# Patient Record
Sex: Male | Born: 1960 | Race: White | Hispanic: No | Marital: Married | Smoking: Never smoker
Health system: Southern US, Community
[De-identification: ages and names within clinical notes are randomized; demographics above are authoritative.]

## PROBLEM LIST (undated history)

## (undated) DIAGNOSIS — E785 Hyperlipidemia, unspecified: Secondary | ICD-10-CM

## (undated) DIAGNOSIS — IMO0001 Reserved for inherently not codable concepts without codable children: Secondary | ICD-10-CM

## (undated) DIAGNOSIS — R9431 Abnormal electrocardiogram [ECG] [EKG]: Secondary | ICD-10-CM

## (undated) DIAGNOSIS — Z8679 Personal history of other diseases of the circulatory system: Secondary | ICD-10-CM

## (undated) HISTORY — DX: Hyperlipidemia, unspecified: E78.5

## (undated) HISTORY — DX: Personal history of other diseases of the circulatory system: Z86.79

## (undated) HISTORY — DX: Reserved for inherently not codable concepts without codable children: IMO0001

## (undated) HISTORY — DX: Abnormal electrocardiogram (ECG) (EKG): R94.31

---
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
## 2001-05-06 ENCOUNTER — Encounter (HOSPITAL_COMMUNITY): Payer: Self-pay | Admitting: Oncology

## 2001-05-06 ENCOUNTER — Encounter (HOSPITAL_COMMUNITY): Admission: RE | Admit: 2001-05-06 | Discharge: 2001-06-05 | Payer: Self-pay | Admitting: Oncology

## 2001-05-06 ENCOUNTER — Encounter: Admission: RE | Admit: 2001-05-06 | Discharge: 2001-05-06 | Payer: Self-pay | Admitting: Oncology

## 2002-05-05 ENCOUNTER — Encounter (HOSPITAL_COMMUNITY): Admission: RE | Admit: 2002-05-05 | Discharge: 2002-06-04 | Payer: Self-pay | Admitting: Oncology

## 2002-05-05 ENCOUNTER — Encounter (HOSPITAL_COMMUNITY): Payer: Self-pay | Admitting: Oncology

## 2002-05-05 ENCOUNTER — Encounter: Admission: RE | Admit: 2002-05-05 | Discharge: 2002-05-05 | Payer: Self-pay | Admitting: Oncology

## 2003-05-07 ENCOUNTER — Encounter (HOSPITAL_COMMUNITY): Admission: RE | Admit: 2003-05-07 | Discharge: 2003-06-06 | Payer: Self-pay | Admitting: Oncology

## 2003-05-07 ENCOUNTER — Encounter (HOSPITAL_COMMUNITY): Payer: Self-pay | Admitting: Oncology

## 2003-05-07 ENCOUNTER — Encounter: Admission: RE | Admit: 2003-05-07 | Discharge: 2003-05-07 | Payer: Self-pay | Admitting: Oncology

## 2004-05-03 ENCOUNTER — Encounter: Admission: RE | Admit: 2004-05-03 | Discharge: 2004-06-02 | Payer: Self-pay | Admitting: Oncology

## 2004-05-03 ENCOUNTER — Encounter (HOSPITAL_COMMUNITY): Admission: RE | Admit: 2004-05-03 | Discharge: 2004-06-02 | Payer: Self-pay | Admitting: Oncology

## 2005-05-02 ENCOUNTER — Ambulatory Visit (HOSPITAL_COMMUNITY): Payer: Self-pay | Admitting: Oncology

## 2005-05-02 ENCOUNTER — Encounter (HOSPITAL_COMMUNITY): Admission: RE | Admit: 2005-05-02 | Discharge: 2005-06-01 | Payer: Self-pay | Admitting: Oncology

## 2005-05-02 ENCOUNTER — Encounter: Admission: RE | Admit: 2005-05-02 | Discharge: 2005-06-01 | Payer: Self-pay | Admitting: Oncology

## 2006-05-07 ENCOUNTER — Encounter (HOSPITAL_COMMUNITY): Admission: RE | Admit: 2006-05-07 | Discharge: 2006-05-31 | Payer: Self-pay | Admitting: Oncology

## 2006-05-07 ENCOUNTER — Ambulatory Visit (HOSPITAL_COMMUNITY): Payer: Self-pay | Admitting: Oncology

## 2006-05-07 ENCOUNTER — Encounter: Admission: RE | Admit: 2006-05-07 | Discharge: 2006-05-31 | Payer: Self-pay | Admitting: Oncology

## 2007-05-06 ENCOUNTER — Encounter (HOSPITAL_COMMUNITY): Admission: RE | Admit: 2007-05-06 | Discharge: 2007-06-03 | Payer: Self-pay | Admitting: Oncology

## 2007-05-06 ENCOUNTER — Ambulatory Visit (HOSPITAL_COMMUNITY): Payer: Self-pay | Admitting: Oncology

## 2008-05-05 ENCOUNTER — Encounter (HOSPITAL_COMMUNITY): Admission: RE | Admit: 2008-05-05 | Discharge: 2008-05-31 | Payer: Self-pay | Admitting: Oncology

## 2008-05-05 ENCOUNTER — Ambulatory Visit (HOSPITAL_COMMUNITY): Payer: Self-pay | Admitting: Oncology

## 2009-05-11 ENCOUNTER — Ambulatory Visit (HOSPITAL_COMMUNITY): Payer: Self-pay | Admitting: Oncology

## 2009-05-11 ENCOUNTER — Encounter (HOSPITAL_COMMUNITY): Admission: RE | Admit: 2009-05-11 | Discharge: 2009-06-01 | Payer: Self-pay | Admitting: Oncology

## 2009-09-03 DIAGNOSIS — IMO0001 Reserved for inherently not codable concepts without codable children: Secondary | ICD-10-CM

## 2009-09-03 HISTORY — DX: Reserved for inherently not codable concepts without codable children: IMO0001

## 2009-11-04 ENCOUNTER — Ambulatory Visit (HOSPITAL_COMMUNITY): Payer: Self-pay | Admitting: Oncology

## 2009-11-04 ENCOUNTER — Encounter (HOSPITAL_COMMUNITY): Admission: RE | Admit: 2009-11-04 | Discharge: 2009-12-04 | Payer: Self-pay | Admitting: Oncology

## 2009-11-28 HISTORY — PX: CARDIOVASCULAR STRESS TEST: SHX262

## 2010-07-07 ENCOUNTER — Ambulatory Visit (HOSPITAL_COMMUNITY): Payer: Self-pay | Admitting: Oncology

## 2010-07-07 ENCOUNTER — Encounter (HOSPITAL_COMMUNITY)
Admission: RE | Admit: 2010-07-07 | Discharge: 2010-08-06 | Payer: Self-pay | Source: Home / Self Care | Admitting: Oncology

## 2010-11-14 LAB — LACTATE DEHYDROGENASE: LDH: 134 U/L (ref 94–250)

## 2010-11-14 LAB — CBC
HCT: 40 % (ref 39.0–52.0)
Hemoglobin: 12.9 g/dL — ABNORMAL LOW (ref 13.0–17.0)
MCH: 24 pg — ABNORMAL LOW (ref 26.0–34.0)
MCHC: 32.2 g/dL (ref 30.0–36.0)
MCV: 74.5 fL — ABNORMAL LOW (ref 78.0–100.0)
Platelets: 332 10*3/uL (ref 150–400)
RBC: 5.36 MIL/uL (ref 4.22–5.81)
RDW: 15.2 % (ref 11.5–15.5)
WBC: 9.4 10*3/uL (ref 4.0–10.5)

## 2010-11-14 LAB — COMPREHENSIVE METABOLIC PANEL
Albumin: 4.3 g/dL (ref 3.5–5.2)
Alkaline Phosphatase: 69 U/L (ref 39–117)
BUN: 14 mg/dL (ref 6–23)
Chloride: 101 mEq/L (ref 96–112)
Creatinine, Ser: 0.89 mg/dL (ref 0.4–1.5)
GFR calc non Af Amer: >60 mL/min (ref >60)
Glucose, Bld: 86 mg/dL (ref 70–99)
Potassium: 4.3 mEq/L (ref 3.5–5.1)
Total Bilirubin: 1 mg/dL (ref 0.3–1.2)

## 2010-11-14 LAB — LIPID PANEL
Cholesterol: 183 mg/dL (ref 0–200)
LDL Cholesterol: 105 mg/dL — ABNORMAL HIGH (ref 0–99)

## 2010-11-26 LAB — LACTATE DEHYDROGENASE: LDH: 136 U/L (ref 94–250)

## 2010-11-26 LAB — COMPREHENSIVE METABOLIC PANEL
AST: 25 U/L (ref 0–37)
CO2: 30 mEq/L (ref 19–32)
Calcium: 9.1 mg/dL (ref 8.4–10.5)
Chloride: 102 mEq/L (ref 96–112)
Creatinine, Ser: 0.97 mg/dL (ref 0.4–1.5)
GFR calc Af Amer: >60 mL/min (ref >60)
GFR calc non Af Amer: >60 mL/min (ref >60)
Glucose, Bld: 83 mg/dL (ref 70–99)
Total Bilirubin: 0.7 mg/dL (ref 0.3–1.2)

## 2010-11-26 LAB — CBC
HCT: 39.6 % (ref 39.0–52.0)
Hemoglobin: 13.1 g/dL (ref 13.0–17.0)
MCHC: 33 g/dL (ref 30.0–36.0)
MCV: 77 fL — ABNORMAL LOW (ref 78.0–100.0)
RBC: 5.15 MIL/uL (ref 4.22–5.81)
WBC: 8.9 10*3/uL (ref 4.0–10.5)

## 2010-11-26 LAB — DIFFERENTIAL
Basophils Absolute: 0 10*3/uL (ref 0.0–0.1)
Eosinophils Absolute: 0.1 10*3/uL (ref 0.0–0.7)
Eosinophils Relative: 1 % (ref 0–5)
Lymphocytes Relative: 22 % (ref 12–46)
Lymphs Abs: 2 10*3/uL (ref 0.7–4.0)
Neutrophils Relative %: 67 % (ref 43–77)

## 2010-11-26 LAB — SEDIMENTATION RATE: Sed Rate: 3 mm/hr (ref 0–16)

## 2010-12-08 LAB — DIFFERENTIAL
Eosinophils Relative: 2 % (ref 0–5)
Lymphocytes Relative: 18 % (ref 12–46)
Lymphs Abs: 1.7 10*3/uL (ref 0.7–4.0)
Monocytes Absolute: 0.7 10*3/uL (ref 0.1–1.0)
Monocytes Relative: 7 % (ref 3–12)

## 2010-12-08 LAB — CBC
MCHC: 33.1 g/dL (ref 30.0–36.0)
MCV: 75.9 fL — ABNORMAL LOW (ref 78.0–100.0)
Platelets: 312 10*3/uL (ref 150–400)
WBC: 9.5 10*3/uL (ref 4.0–10.5)

## 2010-12-08 LAB — LIPID PANEL
Cholesterol: 179 mg/dL (ref 0–200)
HDL: 48 mg/dL (ref >39)
LDL Cholesterol: 108 mg/dL — ABNORMAL HIGH (ref 0–99)
Triglycerides: 113 mg/dL (ref <150)

## 2010-12-08 LAB — COMPREHENSIVE METABOLIC PANEL
ALT: 24 U/L (ref 0–53)
AST: 24 U/L (ref 0–37)
Albumin: 4 g/dL (ref 3.5–5.2)
Calcium: 9.4 mg/dL (ref 8.4–10.5)
Creatinine, Ser: 0.84 mg/dL (ref 0.4–1.5)
GFR calc Af Amer: >60 mL/min (ref >60)
Sodium: 140 mEq/L (ref 135–145)

## 2010-12-08 LAB — TSH: TSH: 2.951 u[IU]/mL (ref 0.350–4.500)

## 2011-03-26 ENCOUNTER — Other Ambulatory Visit: Payer: Self-pay | Admitting: Cardiology

## 2011-03-26 DIAGNOSIS — E785 Hyperlipidemia, unspecified: Secondary | ICD-10-CM

## 2011-03-26 MED ORDER — SIMVASTATIN 40 MG PO TABS: 40.0000 mg | ORAL_TABLET | Freq: Every evening | ORAL | Status: DC

## 2011-03-26 NOTE — Telephone Encounter (Signed)
Patient needs labs.  Wife is going to talk to him and see if he wants to have at [facility] heart care or PCP.

## 2011-03-26 NOTE — Telephone Encounter (Signed)
Called needing a refill of Symbaststin 40mg  filled at [facility] in [place] [phone]. Wasn't sure if he needed to come in to have blood work done in relation to his medication. Please call back. I have pulled the chart.

## 2011-05-09 ENCOUNTER — Telehealth: Payer: Self-pay | Admitting: *Deleted

## 2011-05-09 DIAGNOSIS — E785 Hyperlipidemia, unspecified: Secondary | ICD-10-CM

## 2011-05-09 NOTE — Telephone Encounter (Signed)
Left message in July with wife for patient to call and schedule labs.  Never heard from patient.  Left message today and cancelled remaining refills except for one with request they put not for patient to call and schedule labs.  Left another message at home for patient to call for labs

## 2011-06-06 LAB — COMPREHENSIVE METABOLIC PANEL
AST: 26
CO2: 30
Calcium: 9.6
Creatinine, Ser: 0.82
GFR calc Af Amer: >60
GFR calc non Af Amer: >60

## 2011-06-06 LAB — CBC
MCHC: 32.5
MCV: 75.4 — ABNORMAL LOW
Platelets: 315
RBC: 5.32
RDW: 16.2 — ABNORMAL HIGH

## 2011-06-06 LAB — SEDIMENTATION RATE: Sed Rate: 2

## 2011-06-06 LAB — DIFFERENTIAL
Eosinophils Relative: 2
Lymphocytes Relative: 24
Lymphs Abs: 1.8
Neutrophils Relative %: 66

## 2011-06-06 LAB — LIPID PANEL
HDL: 52
Triglycerides: 84
VLDL: 17

## 2011-06-06 LAB — LACTATE DEHYDROGENASE: LDH: 124

## 2011-06-11 ENCOUNTER — Telehealth: Payer: Self-pay | Admitting: Nurse Practitioner

## 2011-06-11 NOTE — Telephone Encounter (Signed)
Refill needed of simvastatin 40mg , uses [facility] in [place]. Pt out needs asap

## 2011-06-13 ENCOUNTER — Other Ambulatory Visit: Payer: Self-pay | Admitting: *Deleted

## 2011-06-15 LAB — TSH: TSH: 3.809

## 2011-06-15 LAB — DIFFERENTIAL
Basophils Absolute: 0.1
Basophils Relative: 1
Eosinophils Relative: 2
Lymphocytes Relative: 24
Monocytes Absolute: 0.6

## 2011-06-15 LAB — COMPREHENSIVE METABOLIC PANEL
AST: 24
Albumin: 4
Alkaline Phosphatase: 70
Chloride: 102
GFR calc Af Amer: >60
Potassium: 3.7
Total Bilirubin: 0.9

## 2011-06-15 LAB — CBC
Platelets: 391
WBC: 9.4

## 2011-06-15 LAB — LIPID PANEL
HDL: 47
LDL Cholesterol: 127 — ABNORMAL HIGH
Triglycerides: 89

## 2011-07-06 ENCOUNTER — Ambulatory Visit (HOSPITAL_COMMUNITY): Payer: Self-pay | Admitting: Oncology

## 2011-08-03 ENCOUNTER — Other Ambulatory Visit (HOSPITAL_COMMUNITY): Payer: Self-pay | Admitting: Oncology

## 2011-08-03 ENCOUNTER — Other Ambulatory Visit (HOSPITAL_COMMUNITY): Payer: Self-pay

## 2011-08-03 ENCOUNTER — Encounter (HOSPITAL_COMMUNITY): Payer: Self-pay | Admitting: Oncology

## 2011-08-03 ENCOUNTER — Ambulatory Visit (HOSPITAL_COMMUNITY)
Admission: RE | Admit: 2011-08-03 | Discharge: 2011-08-03 | Disposition: A | Discharge status: Home / Self Care | Payer: BC Managed Care – PPO | Source: Ambulatory Visit | Attending: Oncology | Admitting: Oncology

## 2011-08-03 ENCOUNTER — Encounter (HOSPITAL_COMMUNITY): Payer: BC Managed Care – PPO | Attending: Oncology | Admitting: Oncology

## 2011-08-03 VITALS — BP 152/88 | HR 58 | Temp 97.7°F | Ht 70.0 in | Wt 165.0 lb

## 2011-08-03 DIAGNOSIS — Z9221 Personal history of antineoplastic chemotherapy: Secondary | ICD-10-CM | POA: Insufficient documentation

## 2011-08-03 DIAGNOSIS — E7800 Pure hypercholesterolemia, unspecified: Secondary | ICD-10-CM

## 2011-08-03 DIAGNOSIS — C8190 Hodgkin lymphoma, unspecified, unspecified site: Secondary | ICD-10-CM | POA: Insufficient documentation

## 2011-08-03 DIAGNOSIS — Z79899 Other long term (current) drug therapy: Secondary | ICD-10-CM | POA: Insufficient documentation

## 2011-08-03 DIAGNOSIS — Z23 Encounter for immunization: Secondary | ICD-10-CM

## 2011-08-03 LAB — LIPID PANEL
Cholesterol: 176 mg/dL (ref 0–200)
HDL: 66 mg/dL (ref >39)
LDL Cholesterol: 96 mg/dL (ref 0–99)
Triglycerides: 69 mg/dL (ref <150)
VLDL: 14 mg/dL (ref 0–40)

## 2011-08-03 LAB — DIFFERENTIAL
Basophils Absolute: 0.1 10*3/uL (ref 0.0–0.1)
Basophils Relative: 1 % (ref 0–1)
Eosinophils Absolute: 0.2 10*3/uL (ref 0.0–0.7)
Eosinophils Relative: 2 % (ref 0–5)
Lymphocytes Relative: 22 % (ref 12–46)

## 2011-08-03 LAB — COMPREHENSIVE METABOLIC PANEL
ALT: 30 U/L (ref 0–53)
AST: 27 U/L (ref 0–37)
Albumin: 3.8 g/dL (ref 3.5–5.2)
Calcium: 9.5 mg/dL (ref 8.4–10.5)
Creatinine, Ser: 0.84 mg/dL (ref 0.50–1.35)
Sodium: 139 mEq/L (ref 135–145)
Total Protein: 7.5 g/dL (ref 6.0–8.3)

## 2011-08-03 LAB — CBC
MCH: 24.4 pg — ABNORMAL LOW (ref 26.0–34.0)
MCHC: 32.3 g/dL (ref 30.0–36.0)
MCV: 75.4 fL — ABNORMAL LOW (ref 78.0–100.0)
Platelets: 361 10*3/uL (ref 150–400)
RDW: 15.1 % (ref 11.5–15.5)
WBC: 7.8 10*3/uL (ref 4.0–10.5)

## 2011-08-03 MED ORDER — INFLUENZA VIRUS VACC SPLIT PF IM SUSP
INTRAMUSCULAR | Status: AC
  Administered 2011-08-03: 0.5 mL via INTRAMUSCULAR
  Filled 2011-08-03: qty 0.5

## 2011-08-03 MED ORDER — INFLUENZA VIRUS VACC SPLIT PF IM SUSP
0.5000 mL | INTRAMUSCULAR | Status: AC
  Administered 2011-08-03: 0.5 mL via INTRAMUSCULAR

## 2011-08-03 NOTE — Progress Notes (Signed)
Hunter Nunez presents today for injection per MD orders. Flu Vaccine administered IM in left Upper Arm. Administration without incident. Patient tolerated well. Hunter Nunez presented for labwork. Labs per MD order drawn via Peripheral Line 23 gauge needle inserted in right AC  Good blood return present. Procedure without incident.  Needle removed intact. Patient tolerated procedure well.

## 2011-08-03 NOTE — Progress Notes (Signed)
CC:   Hunter Nunez. Deborah Chalk, M.D.  DIAGNOSES: 1. History of stage IVB Hodgkin disease.  Presented with bone marrow     involvement in 1981, treated at that time with MOPP alternated with     ABVD for 12 cycles.  He achieved a clinical remission but then     relapsed in 1984 with liver and spleen involvement.  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  He also had a liver     biopsy.  He was then treated with cisplatin, VP-16 and CCNU, the     latter alternating with Adriamycin, and he achieved another     clinical remission and has been in clinical remission ever since. 2. Hypercholesterolemia treated by Dr. Casimer Lanius with simvastatin with nice     control. 3. Reactive anxiety in the past over his job issues. Genevie Cheshire has no B symptomatology, asymptomatic.  His medications include only simvastatin 40 mg a day.  His vital signs are all very stable.  He looks great.  5 feet 10 inches tall, weighs is 165 pounds.  His BMI is 23.7.  Blood pressure 152/88, pulse 60 and regular, respirations 16 and unlabored.  Temperature is normal.  He has no pain.  He has no lymphadenopathy, no thyromegaly. Lungs:  Clear.  Heart:  Shows regular rhythm and rate without murmur, rub or gallop.  He has no gynecomastia.  Abdomen:  Soft and nontender without hepatomegaly.  He has no peripheral edema of the arms or legs. Bowel sounds are normal.  So he looks fabulous.  His labs today do show that he has excellent control of his cholesterol, which is well within the normal range presently.  Total is 176, LDL is 96.  His HDL cholesterol is 66.  CMET is normal.  CBC still shows a mild microcytosis with an MCV of 75, that is not new or different. Hemoglobin 13.1, white count and platelets are normal.  Differential is unremarkable.  So a TSH is pending.  We will see what that is and then call him with the results.  We will see him in a year, sooner if need be.    ______________________________ Ladona Horns.  Mariel Sleet, MD ESN/MEDQ  D:  08/03/2011  T:  08/03/2011  Job:  [id number]

## 2011-08-03 NOTE — Patient Instructions (Signed)
KAYAN BLISSETT  [id number] 25-Aug-1961  [facility]  Discharge Instructions  RECOMMENDATIONS MADE BY THE CONSULTANT AND ANY TEST RESULTS WILL BE SENT TO YOUR REFERRING DOCTOR.   EXAM FINDINGS BY MD TODAY AND SIGNS AND SYMPTOMS TO REPORT TO CLINIC OR PRIMARY MD: You are doing well.  We will give you your flu vaccine, check some labs and do a chest xray.  MEDICATIONS PRESCRIBED: none   INSTRUCTIONS GIVEN AND DISCUSSED: Other :  Report frequent infections, night sweats, etc.  SPECIAL INSTRUCTIONS/FOLLOW-UP: Return to Clinic on 1 year.   I acknowledge that I have been informed and understand all the instructions given to me and received a copy. I do not have any more questions at this time, but understand that I may call the Specialty Clinic at [facility] at [phone] during business hours should I have any further questions or need assistance in obtaining follow-up care.    __________________________________________  _____________  __________ Signature of Patient or Authorized Representative            Date                   Time    __________________________________________ Nurse's Signature

## 2011-09-03 ENCOUNTER — Other Ambulatory Visit: Payer: Self-pay | Admitting: Nurse Practitioner

## 2011-09-06 ENCOUNTER — Telehealth: Payer: Self-pay | Admitting: Cardiology

## 2011-09-06 DIAGNOSIS — E785 Hyperlipidemia, unspecified: Secondary | ICD-10-CM

## 2011-09-06 MED ORDER — SIMVASTATIN 40 MG PO TABS: 40.0000 mg | ORAL_TABLET | Freq: Every evening | ORAL | Status: DC

## 2011-09-06 NOTE — Telephone Encounter (Signed)
New Problem:    Patient's wife called needing a refill of her husbands simvastatin (ZOCOR) 40 MG tablet.  Please call back if necessary.

## 2011-09-12 ENCOUNTER — Encounter: Payer: Self-pay | Admitting: Nurse Practitioner

## 2011-09-14 ENCOUNTER — Ambulatory Visit (INDEPENDENT_AMBULATORY_CARE_PROVIDER_SITE_OTHER): Payer: BC Managed Care – PPO | Admitting: Nurse Practitioner

## 2011-09-14 ENCOUNTER — Encounter: Payer: Self-pay | Admitting: Nurse Practitioner

## 2011-09-14 VITALS — BP 130/88 | HR 60 | Ht 70.0 in | Wt 165.0 lb

## 2011-09-14 DIAGNOSIS — E785 Hyperlipidemia, unspecified: Secondary | ICD-10-CM

## 2011-09-14 MED ORDER — SIMVASTATIN 40 MG PO TABS: 40.0000 mg | ORAL_TABLET | Freq: Every evening | ORAL | Status: DC

## 2011-09-14 NOTE — Progress Notes (Signed)
   Hunter Nunez Date of Birth: 02/04/1961 Medical Record #[id number]  History of Present Illness: Hunter Nunez is seen today for a follow up visit. He is seen for Dr. Swaziland. He is a former patient of Dr. Ronnald Nian. He has a remote history of Hodgkin's and remains in remission. He has had recent oncology follow up and has gotten a good report. He has no cardiac complaints. He denies chest pain, shortness of breath, lightheadedness or dizziness. He has had his lipids checked back in November and those results look good. He does need his medicine refilled.   Current Outpatient Prescriptions on File Prior to Visit  Medication Sig Dispense Refill  . DISCONTD: simvastatin (ZOCOR) 40 MG tablet Take 1 tablet (40 mg total) by mouth every evening.  30 tablet  1    Allergies  Allergen Reactions  . Ivp Dye (Iodinated Diagnostic Agents) Hives  . Penicillins Hives  . Reglan Other (See Comments)    Drawing sensation    Past Medical History  Diagnosis Date  . Hodgkin's disease 1980    AGE 51; followed by Dr. Mariel Sleet  . Hyperlipidemia   . Normal nuclear stress test 2011    EF is 53%. No ischemia.  Marland Kitchen History of sinus bradycardia   . Abnormal EKG     Negative stress test in 2011    Past Surgical History  Procedure Date  . Splenectomy 1980  . Port-a-cath removal 1980    insertion and then removal after chemo  . Cardiovascular stress test 11/28/2009    EF 53%    History  Smoking status  . Never Smoker   Smokeless tobacco  . Not on file    History  Alcohol Use No    Family History  Problem Relation Age of Onset  . Heart disease Sister   . Diabetes Sister   . Hypertension Sister   . Diabetes Mother     Review of Systems: The review of systems is per the HPI.  All other systems were reviewed and are negative.  Physical Exam: BP 130/88  Pulse 60  Ht 5\' 10"  (1.778 m)  Wt 165 lb (74.844 kg)  BMI 23.68 kg/m2 Patient is very pleasant and in no acute distress. Skin is  warm and dry. Color is normal.  HEENT is unremarkable. Normocephalic/atraumatic. PERRL. Sclera are nonicteric. Neck is supple. No masses. No JVD. Lungs are clear. Cardiac exam shows a regular rate and rhythm. Abdomen is soft. Extremities are without edema. Gait and ROM are intact. No gross neurologic deficits noted.    Lab Results  Component Value Date   WBC 7.8 08/03/2011   HGB 13.1 08/03/2011   HCT 40.5 08/03/2011   PLT 361 08/03/2011   GLUCOSE 88 08/03/2011   CHOL 176 08/03/2011   TRIG 69 08/03/2011   HDL 66 08/03/2011   LDLCALC 96 08/03/2011   ALT 30 08/03/2011   AST 27 08/03/2011   NA 139 08/03/2011   K 4.3 08/03/2011   CL 102 08/03/2011   CREATININE 0.84 08/03/2011   BUN 10 08/03/2011   CO2 30 08/03/2011   TSH 2.712 08/03/2011   PSA 0.62 Test Methodology: Hybr[employer] PSA 05/06/2007     Assessment / Plan:

## 2011-09-14 NOTE — Patient Instructions (Signed)
I have refilled your medicine.  We will see you back in a year.  Call the [facility] office at [phone] if you have any questions, problems or concerns.

## 2011-09-14 NOTE — Assessment & Plan Note (Signed)
Labs were reviewed. I have refilled his Zocor. We will see him in a year. He is doing well overall and has no complaint. He had his last stress test in 2011. We will continue to follow him along clinically. Patient is agreeable to this plan and will call if any problems develop in the interim.

## 2012-01-30 ENCOUNTER — Other Ambulatory Visit (HOSPITAL_COMMUNITY): Payer: BC Managed Care – PPO

## 2012-02-01 ENCOUNTER — Encounter (HOSPITAL_COMMUNITY): Payer: BC Managed Care – PPO | Attending: Oncology

## 2012-02-01 DIAGNOSIS — E7800 Pure hypercholesterolemia, unspecified: Secondary | ICD-10-CM | POA: Insufficient documentation

## 2012-02-01 LAB — HEPATIC FUNCTION PANEL
ALT: 22 U/L (ref 0–53)
Albumin: 3.8 g/dL (ref 3.5–5.2)
Alkaline Phosphatase: 69 U/L (ref 39–117)
Indirect Bilirubin: 0.3 mg/dL (ref 0.3–0.9)
Total Protein: 6.5 g/dL (ref 6.0–8.3)

## 2012-02-01 LAB — LIPID PANEL: LDL Cholesterol: 73 mg/dL (ref 0–99)

## 2012-02-01 NOTE — Progress Notes (Signed)
Labs drawn today for lipid panel, liver panel

## 2012-08-01 ENCOUNTER — Ambulatory Visit (HOSPITAL_COMMUNITY): Payer: BC Managed Care – PPO | Admitting: Oncology

## 2012-08-04 ENCOUNTER — Ambulatory Visit (HOSPITAL_COMMUNITY): Payer: BC Managed Care – PPO | Admitting: Oncology

## 2012-08-08 ENCOUNTER — Encounter (HOSPITAL_COMMUNITY): Payer: Self-pay | Admitting: Oncology

## 2012-08-08 ENCOUNTER — Ambulatory Visit (HOSPITAL_COMMUNITY)
Admission: RE | Admit: 2012-08-08 | Discharge: 2012-08-08 | Disposition: A | Discharge status: Home / Self Care | Payer: BC Managed Care – PPO | Source: Ambulatory Visit | Attending: Oncology | Admitting: Oncology

## 2012-08-08 ENCOUNTER — Encounter (HOSPITAL_COMMUNITY): Payer: BC Managed Care – PPO | Attending: Oncology | Admitting: Oncology

## 2012-08-08 VITALS — BP 131/77 | HR 72 | Temp 97.0°F | Resp 20 | Ht 70.0 in | Wt 161.1 lb

## 2012-08-08 DIAGNOSIS — Z8571 Personal history of Hodgkin lymphoma: Secondary | ICD-10-CM

## 2012-08-08 DIAGNOSIS — F411 Generalized anxiety disorder: Secondary | ICD-10-CM | POA: Insufficient documentation

## 2012-08-08 DIAGNOSIS — C8190 Hodgkin lymphoma, unspecified, unspecified site: Secondary | ICD-10-CM | POA: Insufficient documentation

## 2012-08-08 DIAGNOSIS — E785 Hyperlipidemia, unspecified: Secondary | ICD-10-CM

## 2012-08-08 DIAGNOSIS — Z09 Encounter for follow-up examination after completed treatment for conditions other than malignant neoplasm: Secondary | ICD-10-CM | POA: Insufficient documentation

## 2012-08-08 DIAGNOSIS — E7800 Pure hypercholesterolemia, unspecified: Secondary | ICD-10-CM | POA: Insufficient documentation

## 2012-08-08 LAB — CBC WITH DIFFERENTIAL/PLATELET
Basophils Absolute: 0.1 10*3/uL (ref 0.0–0.1)
Eosinophils Absolute: 0.2 10*3/uL (ref 0.0–0.7)
Lymphocytes Relative: 23 % (ref 12–46)
Lymphs Abs: 2.1 10*3/uL (ref 0.7–4.0)
Neutrophils Relative %: 66 % (ref 43–77)
Platelets: 394 10*3/uL (ref 150–400)
RBC: 5.14 MIL/uL (ref 4.22–5.81)
WBC: 9.5 10*3/uL (ref 4.0–10.5)

## 2012-08-08 LAB — COMPREHENSIVE METABOLIC PANEL
ALT: 54 U/L — ABNORMAL HIGH (ref 0–53)
AST: 32 U/L (ref 0–37)
Alkaline Phosphatase: 83 U/L (ref 39–117)
CO2: 27 mEq/L (ref 19–32)
GFR calc Af Amer: >90 mL/min (ref >90)
Glucose, Bld: 80 mg/dL (ref 70–99)
Potassium: 4.7 mEq/L (ref 3.5–5.1)
Sodium: 140 mEq/L (ref 135–145)
Total Protein: 7.2 g/dL (ref 6.0–8.3)

## 2012-08-08 LAB — LIPID PANEL
HDL: 61 mg/dL (ref >39)
LDL Cholesterol: 79 mg/dL (ref 0–99)
VLDL: 15 mg/dL (ref 0–40)

## 2012-08-08 NOTE — Patient Instructions (Addendum)
[facility] Discharge Instructions  RECOMMENDATIONS MADE BY THE CONSULTANT AND ANY TEST RESULTS WILL BE SENT TO YOUR REFERRING PHYSICIAN.   Lab work today. Chest x ray today before leaving the hospital. Dr.Neijstrom will make a referral to his GI provider for consultation for a colonoscopy. Return to clinic in 1 year to see Dr.Neijstrom. Report any issues/concerns to clinic as needed.   Thank you for choosing [facility] to provide your oncology and hematology care.  To afford each patient quality time with our providers, please arrive at least 15 minutes before your scheduled appointment time.  With your help, our goal is to use those 15 minutes to complete the necessary work-up to ensure our physicians have the information they need to help with your evaluation and healthcare recommendations.    Effective January 1st, 2014, we ask that you re-schedule your appointment with our physicians should you arrive 10 or more minutes late for your appointment.  We strive to give you quality time with our providers, and arriving late affects you and other patients whose appointments are after yours.    Again, thank you for choosing [facility].  Our hope is that these requests will decrease the amount of time that you wait before being seen by our physicians.       _____________________________________________________________  I acknowledge that I have been informed and understand all the instructions given to me and received a copy. I do not have anymore questions at this time but understand that I may call the Cancer Center at [facility] at [phone] during business hours should I have any further questions or need assistance in obtaining follow-up care.    __________________________________________  _____________  __________ Signature of Patient or Authorized Representative            Date                    Time    __________________________________________ Nurse's Signature

## 2012-08-08 NOTE — Progress Notes (Signed)
No primary provider on file. No primary provider on file.  1. Hyperlipidemia   2. H/O Hodgkin's disease     CURRENT THERAPY: [facility]  INTERVAL HISTORY: Hunter Nunez 51 y.o. male returns for  regular  visit for followup of  History of stage IVB Hodgkin disease. Presented with bone marrow involvement in 1981, treated at that time with MOPP alternated with ABVD for 12 cycles. He achieved a clinical remission but then relapsed in 1984 with liver and spleen involvement. 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 He also had a liver biopsy. He was then treated with cisplatin, VP-16 and CCNU, the latter alternating with Adriamycin, and he achieved another clinical remission and has been in clinical remission ever since.   Hunter Nunez is doing well.  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    He denies any complaints.  He denies any B symptoms including fevers, chills, night sweats, weight loss, decreased appetite abdominal pain.  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   Complete ROS questioning is negative.    Past Medical History  Diagnosis Date  . Hodgkin's disease(201) 1980    AGE 83; followed by Dr. Mariel Sleet  . Hyperlipidemia   . Normal nuclear stress test 2011    EF is 53%. No ischemia.  Marland Kitchen History of sinus bradycardia   . Abnormal EKG     Negative stress test in 2011    has Hyperlipidemia and H/O Hodgkin's disease on his problem list.     is allergic to ivp dye; penicillins; and metoclopramide hcl.  Mr. Stammer does not currently have medications on file.  Past Surgical History  Procedure Date  . Splenectomy 1980  . Port-a-cath removal 1980    insertion and then removal after chemo  . Cardiovascular stress test 11/28/2009    EF 53%    Denies any headaches, dizziness, double vision, fevers, chills, night sweats, nausea, vomiting, diarrhea, constipation, chest pain, heart  palpitations, shortness of breath, blood in stool, black tarry stool, urinary pain, urinary burning, urinary frequency, hematuria.   PHYSICAL EXAMINATION  ECOG PERFORMANCE STATUS: 0 - Asymptomatic  Filed Vitals:   08/08/12 1300  BP: 131/77  Pulse: 72  Temp: 97 F (36.1 C)  Resp: 20    GENERAL:alert, healthy, no distress, well nourished, well developed, comfortable, cooperative and smiling SKIN: skin color, texture, turgor are normal, no rashes or significant lesions HEAD: Normocephalic, No masses, lesions, tenderness or abnormalities EYES: normal, Conjunctiva are pink and non-injected EARS: External ears normal OROPHARYNX:lips, buccal mucosa, and tongue normal and mucous membranes are moist  NECK: supple, no adenopathy, thyroid normal size, non-tender, without nodularity, no stridor, non-tender, trachea midline LYMPH:  no palpable lymphadenopathy, no hepatosplenomegaly BREAST:not examined LUNGS: clear to auscultation and percussion HEART: regular rate & rhythm, no murmurs, no gallops, S1 normal and S2 normal ABDOMEN:abdomen soft, non-tender, normal bowel sounds, no masses or organomegaly and no hepatosplenomegaly BACK: Back symmetric, no curvature., No CVA tenderness EXTREMITIES:less then 2 second capillary refill, no joint deformities, effusion, or inflammation, no edema, no skin discoloration, no clubbing, no cyanosis  NEURO: alert & oriented x 3 with fluent speech, no focal motor/sensory deficits, gait normal   ASSESSMENT:  1. History of stage IVB Hodgkin disease. Presented with bone marrow involvement in 1981, treated at that time with MOPP alternated with ABVD for 12 cycles. He achieved a clinical remission but then relapsed in  1984 with liver and spleen involvement. 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 He also had a liver biopsy. He was then treated with cisplatin, VP-16 and CCNU, the latter alternating with Adriamycin, and he achieved another clinical  remission and has been in clinical remission ever since.  2. Hypercholesterolemia treated by Dr. Swaziland with simvastatin with nice control.  3. Reactive anxiety in the past over his job issues.   PLAN:  1. Labs today: CBC diff, CMET, Lipid panel, LDH 2. Chest X-ray for surveillance of Hodgkin's Lymphoma 3. Referral to [facility] for colonoscopy.  4. Continue to follow-up with Cardiology as directed.  5. Return in one year for follow-up.   All questions were answered. The patient knows to call the clinic with any problems, questions or concerns. We can certainly see the patient much sooner if necessary.  The patient and plan discussed with Glenford Peers, MD and he is in agreement with the aforementioned.  Patient seen by Dr. Mariel Sleet as well.   KEFALAS,THOMAS

## 2012-10-07 ENCOUNTER — Other Ambulatory Visit: Payer: Self-pay | Admitting: Nurse Practitioner

## 2012-11-28 ENCOUNTER — Encounter: Payer: Self-pay | Admitting: Nurse Practitioner

## 2012-11-28 ENCOUNTER — Ambulatory Visit (INDEPENDENT_AMBULATORY_CARE_PROVIDER_SITE_OTHER): Payer: BC Managed Care – PPO | Admitting: Nurse Practitioner

## 2012-11-28 VITALS — BP 180/90 | HR 64 | Ht 70.0 in | Wt 161.8 lb

## 2012-11-28 DIAGNOSIS — R030 Elevated blood-pressure reading, without diagnosis of hypertension: Secondary | ICD-10-CM

## 2012-11-28 DIAGNOSIS — IMO0001 Reserved for inherently not codable concepts without codable children: Secondary | ICD-10-CM

## 2012-11-28 DIAGNOSIS — E785 Hyperlipidemia, unspecified: Secondary | ICD-10-CM

## 2012-11-28 LAB — HEPATIC FUNCTION PANEL
ALT: 27 U/L (ref 0–53)
AST: 26 U/L (ref 0–37)
Albumin: 3.9 g/dL (ref 3.5–5.2)
Alkaline Phosphatase: 67 U/L (ref 39–117)
Bilirubin, Direct: 0 mg/dL (ref 0.0–0.3)
Total Bilirubin: 0.7 mg/dL (ref 0.3–1.2)
Total Protein: 7.3 g/dL (ref 6.0–8.3)

## 2012-11-28 MED ORDER — SIMVASTATIN 40 MG PO TABS: 40.0000 mg | ORAL_TABLET | Freq: Every day | ORAL | Status: DC

## 2012-11-28 NOTE — Progress Notes (Signed)
Hunter Nunez Date of Birth: 24-Oct-1960 Medical Record #[id number]  History of Present Illness: Hunter Nunez is seen back today for a follow up visit. He is seen for Dr. Swaziland. He is a former patient of Dr. Ronnald Nian. Last visit here was in January of 2013. He has a remote history of Hodgkin's and remains in remission. No known CAD. Negative stress test in 2011. Has had prior splenectomy. Also with HLD and on Zocor.   Last seen in January of 2013. Was doing well.   He comes in today. He is here alone. Doing well. No complaints. No chest pain. Not short of breath. Good report from oncology. Needs his Zocor refilled. Labs from December are noted. ALT was just slightly up. BP is up today. Was good at his oncology visit in December. No real extra salt use. No cold medicines/energy drinks/supplements.  Does not check at home. Not dizzy or lightheaded.    Current Outpatient Prescriptions on File Prior to Visit  Medication Sig Dispense Refill  . simvastatin (ZOCOR) 40 MG tablet TAKE ONE TABLET BY MOUTH IN THE EVENING  30 tablet  0   No current facility-administered medications on file prior to visit.    Allergies  Allergen Reactions  . Ivp Dye (Iodinated Diagnostic Agents) Hives  . Penicillins Hives  . Metoclopramide Hcl Other (See Comments)    Drawing sensation    Past Medical History  Diagnosis Date  . Hodgkin's disease(201) 1980    AGE 52; followed by Dr. Mariel Sleet  . Hyperlipidemia   . Normal nuclear stress test 2011    EF is 53%. No ischemia.  Marland Kitchen History of sinus bradycardia   . Abnormal EKG     Negative stress test in 2011    Past Surgical History  Procedure Laterality Date  . Splenectomy  1980  . Port-a-cath removal  1980    insertion and then removal after chemo  . Cardiovascular stress test  11/28/2009    EF 53%    History  Smoking status  . Never Smoker   Smokeless tobacco  . Not on file    History  Alcohol Use No    Family History  Problem  Relation Age of Onset  . Heart disease Sister   . Diabetes Sister   . Hypertension Sister   . Diabetes Mother     Review of Systems: The review of systems is per the HPI.  All other systems were reviewed and are negative.  Physical Exam: BP 180/90  Pulse 64  Ht 5\' 10"  (1.778 m)  Wt 161 lb 12.8 oz (73.392 kg)  BMI 23.22 kg/m2 BP by me is 140/90 in the right arm; 150/100 in the left. Patient is very pleasant and in no acute distress. He is thin. Skin is warm and dry. Color is normal.  HEENT is unremarkable. Normocephalic/atraumatic. PERRL. Sclera are nonicteric. Neck is supple. No masses. No JVD. Lungs are clear. Cardiac exam shows a regular rate and rhythm. Abdomen is soft. Extremities are without edema. Gait and ROM are intact. No gross neurologic deficits noted.  LABORATORY DATA: Pending   Lab Results  Component Value Date   WBC 9.5 08/08/2012   HGB 12.4* 08/08/2012   HCT 37.9* 08/08/2012   PLT 394 08/08/2012   GLUCOSE 80 08/08/2012   CHOL 155 08/08/2012   TRIG 75 08/08/2012   HDL 61 08/08/2012   LDLCALC 79 08/08/2012   ALT 54* 08/08/2012   AST 32 08/08/2012   NA 140 08/08/2012  K 4.7 08/08/2012   CL 102 08/08/2012   CREATININE 1.02 08/08/2012   BUN 14 08/08/2012   CO2 27 08/08/2012   TSH 2.712 08/03/2011   PSA 0.62 Test Methodology: [employer] PSA 05/06/2007    Assessment / Plan: 1. HLD - recheck his LFT's today. Zocor is refilled.   2. Negative stress test in 2011  3. Hodgkin's - in remission - followed by Dr. Mariel Sleet  4. Elevated BP - he is agreeable to getting a blood pressure cuff and monitoring at home. Goal is less than 135/85 most of the time. He is monitor more often over these next several weeks. If remains above goal, he will be back in touch with me.   Patient is agreeable to this plan and will call if any problems develop in the interim.   Rosalio Macadamia, RN, ANP-C [facility] [address] [place], [place]  [address]

## 2012-11-28 NOTE — Patient Instructions (Addendum)
We will refill you Zocor today  We need to check your liver test today  Get a blood pressure cuff (Omron) and monitor your blood pressure - more frequently over the next month - goal is to be less than 135/85 most of the time. If it is not, call me  See you back in one year  Exercise and stay active  Call the [facility] office at [phone] if you have any questions, problems or concerns.

## 2012-12-03 ENCOUNTER — Encounter: Payer: Self-pay | Admitting: Oncology

## 2013-08-07 ENCOUNTER — Encounter (HOSPITAL_COMMUNITY): Payer: BC Managed Care – PPO | Attending: Hematology and Oncology

## 2013-08-07 ENCOUNTER — Encounter (HOSPITAL_COMMUNITY): Payer: Self-pay

## 2013-08-07 VITALS — BP 160/80 | HR 69 | Temp 98.3°F | Resp 16 | Wt 165.0 lb

## 2013-08-07 DIAGNOSIS — Z8571 Personal history of Hodgkin lymphoma: Secondary | ICD-10-CM

## 2013-08-07 DIAGNOSIS — E785 Hyperlipidemia, unspecified: Secondary | ICD-10-CM

## 2013-08-07 DIAGNOSIS — Z23 Encounter for immunization: Secondary | ICD-10-CM

## 2013-08-07 DIAGNOSIS — C8190 Hodgkin lymphoma, unspecified, unspecified site: Secondary | ICD-10-CM | POA: Insufficient documentation

## 2013-08-07 LAB — CBC WITH DIFFERENTIAL/PLATELET
Basophils Absolute: 0 10*3/uL (ref 0.0–0.1)
HCT: 41.5 % (ref 39.0–52.0)
Hemoglobin: 13.5 g/dL (ref 13.0–17.0)
Lymphocytes Relative: 23 % (ref 12–46)
Lymphs Abs: 1.9 10*3/uL (ref 0.7–4.0)
Monocytes Absolute: 0.8 10*3/uL (ref 0.1–1.0)
Neutro Abs: 5.3 10*3/uL (ref 1.7–7.7)
Neutrophils Relative %: 66 % (ref 43–77)
Platelets: 344 10*3/uL (ref 150–400)
RDW: 15.1 % (ref 11.5–15.5)
WBC: 8.1 10*3/uL (ref 4.0–10.5)

## 2013-08-07 LAB — COMPREHENSIVE METABOLIC PANEL
ALT: 31 U/L (ref 0–53)
AST: 29 U/L (ref 0–37)
Albumin: 4.2 g/dL (ref 3.5–5.2)
Alkaline Phosphatase: 81 U/L (ref 39–117)
CO2: 29 mEq/L (ref 19–32)
Calcium: 9.6 mg/dL (ref 8.4–10.5)
GFR calc non Af Amer: >90 mL/min (ref >90)
Sodium: 137 mEq/L (ref 135–145)
Total Protein: 8 g/dL (ref 6.0–8.3)

## 2013-08-07 LAB — LIPID PANEL
LDL Cholesterol: 93 mg/dL (ref 0–99)
Triglycerides: 73 mg/dL (ref <150)
VLDL: 15 mg/dL (ref 0–40)

## 2013-08-07 LAB — RETICULOCYTES
RBC.: 5.53 MIL/uL (ref 4.22–5.81)
Retic Count, Absolute: 44.2 10*3/uL (ref 19.0–186.0)
Retic Ct Pct: 0.8 % (ref 0.4–3.1)

## 2013-08-07 MED ORDER — INFLUENZA VAC SPLIT QUAD 0.5 ML IM SUSP
INTRAMUSCULAR | Status: AC
  Filled 2013-08-07: qty 0.5

## 2013-08-07 MED ORDER — INFLUENZA VAC SPLIT QUAD 0.5 ML IM SUSP
0.5000 mL | Freq: Once | INTRAMUSCULAR | Status: AC
  Administered 2013-08-07: 0.5 mL via INTRAMUSCULAR

## 2013-08-07 NOTE — Progress Notes (Signed)
[facility]  OFFICE PROGRESS NOTE  Rebeca Allegra, MD [address] [place] [address]  DIAGNOSIS: H/O lymphocyte-depleted Hodgkin's disease - Plan: CBC with Differential, Reticulocytes, Comprehensive metabolic panel, Lactate dehydrogenase, Beta 2 microglobuline, serum, C-reactive protein, CBC with Differential, Reticulocytes, Lactate dehydrogenase, Comprehensive metabolic panel, CBC with Differential, Reticulocytes, Comprehensive metabolic panel, Lactate dehydrogenase, Beta 2 microglobuline, serum, C-reactive protein  Hyperlipidemia - Plan: Lipid panel, Lipid panel, Lipid panel  Chief Complaint  Patient presents with  . h/o Hodgkin's Disease    CURRENT THERAPY: Watchful expectation.  INTERVAL HISTORY: VONDELL BABERS 52 y.o. 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 He relapsed in 1984 with liver and spleen involvement and was treated with splenectomy as well as cisplatin, VP-16, and CCNU the latter alternating with doxorubicin achieving another clinical remission and since then has been followed with no active therapy.  He works full-time as a Loss adjuster, chartered. Appetite good no sore throat, headache, nasal drip, PND, orthopnea, palpitations, or headache. Bowel movements are regular with no diarrhea, constipation, melena, hematochezia, hematuria, urinary hesitancy, peripheral paresthesias, epistaxis, hemoptysis, chest pain, or shortness of breath. He requests influenza virus vaccine today with which I wholeheartedly agree.   MEDICAL HISTORY: Past Medical History  Diagnosis Date  . Hodgkin's disease 1980    AGE 75; followed by Dr. Mariel Sleet  . Hyperlipidemia   . Normal nuclear stress test 2011    EF is 53%. No ischemia.  Marland Kitchen History of sinus bradycardia   . Abnormal EKG       Negative stress test in 2011    INTERIM HISTORY: has Hyperlipidemia and H/O Hodgkin's disease on his problem list.   52 y.o. male returns for regular visit for followup of History of stage IVB Hodgkin disease. Presented with bone marrow involvement in 1981, treated at that time with MOPP alternated with ABVD for 12 cycles. He achieved a clinical remission but then relapsed in 1984 with liver and spleen involvement. 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 He also had a liver biopsy. He was then treated with cisplatin, VP-16 and CCNU, the latter alternating with Adriamycin, and he achieved another clinical remission and has been in clinical remission ever since  ALLERGIES:  is allergic to ivp dye; penicillins; and metoclopramide hcl.  MEDICATIONS: has a current medication list which includes the following prescription(s): simvastatin.  SURGICAL HISTORY:  Past Surgical History  Procedure Laterality Date  . Splenectomy  1980  . Port-a-cath removal  1980    insertion and then removal after chemo  . Cardiovascular stress test  11/28/2009    EF 53%    FAMILY HISTORY: family history includes Diabetes in his mother and sister; Heart disease in his sister; Hypertension in his sister.  SOCIAL HISTORY:  reports that he has never smoked. He has never used smokeless tobacco. He reports that he does not drink alcohol or use illicit drugs.  REVIEW OF SYSTEMS:  Other than that discussed above is noncontributory.  PHYSICAL EXAMINATION: ECOG PERFORMANCE STATUS: 0 - Asymptomatic  Blood pressure 160/80, pulse 69, temperature 98.3 F (36.8 C), temperature source Oral, resp. rate 16, weight 165 lb (74.844 kg).  GENERAL:alert, no distress and comfortable SKIN: skin color, texture, turgor are normal, no rashes or significant lesions EYES: PERLA; Conjunctiva are pink and non-injected, sclera  clear OROPHARYNX:no exudate, no erythema on lips, buccal mucosa, or tongue. NECK: supple, thyroid  normal size, non-tender, without nodularity. No masses CHEST: Normal AP diameter with no breast masses. No evidence of gynecomastia. LYMPH:  no palpable lymphadenopathy in the cervical, axillary or inguinal LUNGS: clear to auscultation and percussion with normal breathing effort HEART: regular rate & rhythm and no murmurs. ABDOMEN:abdomen soft, non-tender and normal bowel sounds MUSCULOSKELETAL:no cyanosis of digits and no clubbing. Range of motion normal.  NEURO: alert & oriented x 3 with fluent speech, no focal motor/sensory deficits   LABORATORY DATA: Office Visit on 08/07/2013  Component Date Value Range Status  . WBC 08/07/2013 8.1  4.0 - 10.5 K/uL Final  . RBC 08/07/2013 5.53  4.22 - 5.81 MIL/uL Final  . Hemoglobin 08/07/2013 13.5  13.0 - 17.0 g/dL Final  . HCT 16/06/9603 41.5  39.0 - 52.0 % Final  . MCV 08/07/2013 75.0* 78.0 - 100.0 fL Final  . MCH 08/07/2013 24.4* 26.0 - 34.0 pg Final  . MCHC 08/07/2013 32.5  30.0 - 36.0 g/dL Final  . RDW 54/05/8118 15.1  11.5 - 15.5 % Final  . Platelets 08/07/2013 344  150 - 400 K/uL Final  . Neutrophils Relative % 08/07/2013 66  43 - 77 % Final  . Neutro Abs 08/07/2013 5.3  1.7 - 7.7 K/uL Final  . Lymphocytes Relative 08/07/2013 23  12 - 46 % Final  . Lymphs Abs 08/07/2013 1.9  0.7 - 4.0 K/uL Final  . Monocytes Relative 08/07/2013 10  3 - 12 % Final  . Monocytes Absolute 08/07/2013 0.8  0.1 - 1.0 K/uL Final  . Eosinophils Relative 08/07/2013 1  0 - 5 % Final  . Eosinophils Absolute 08/07/2013 0.1  0.0 - 0.7 K/uL Final  . Basophils Relative 08/07/2013 1  0 - 1 % Final  . Basophils Absolute 08/07/2013 0.0  0.0 - 0.1 K/uL Final  . Retic Ct Pct 08/07/2013 0.8  0.4 - 3.1 % Final  . RBC. 08/07/2013 5.53  4.22 - 5.81 MIL/uL Final  . Retic Count, Manual 08/07/2013 44.2  19.0 - 186.0 K/uL Final  . Sodium 08/07/2013 137  135 - 145 mEq/L Final  . Potassium 08/07/2013 4.0  3.5 - 5.1 mEq/L Final  . Chloride 08/07/2013 99  96 - 112 mEq/L Final  .  CO2 08/07/2013 29  19 - 32 mEq/L Final  . Glucose, Bld 08/07/2013 86  70 - 99 mg/dL Final  . BUN 14/78/2956 14  6 - 23 mg/dL Final  . Creatinine, Ser 08/07/2013 0.96  0.50 - 1.35 mg/dL Final  . Calcium 21/30/8657 9.6  8.4 - 10.5 mg/dL Final  . Total Protein 08/07/2013 8.0  6.0 - 8.3 g/dL Final  . Albumin 84/69/6295 4.2  3.5 - 5.2 g/dL Final  . AST 28/41/3244 29  0 - 37 U/L Final  . ALT 08/07/2013 31  0 - 53 U/L Final  . Alkaline Phosphatase 08/07/2013 81  39 - 117 U/L Final  . Total Bilirubin 08/07/2013 0.5  0.3 - 1.2 mg/dL Final  . GFR calc non Af Amer 08/07/2013 >90  >90 mL/min Final  . GFR calc Af Amer 08/07/2013 >90  >90 mL/min Final   Comment: (NOTE)                          The eGFR has been calculated using the CKD EPI equation.  This calculation has not been validated in all clinical situations.                          eGFR's persistently <90 mL/min signify possible Chronic Kidney                          Disease.  Marland Kitchen LDH 08/07/2013 192  94 - 250 U/L Final    PATHOLOGY: No new pathology.  Urinalysis No results found for this basename: colorurine,  appearanceur,  labspec,  phurine,  glucoseu,  hgbur,  bilirubinur,  ketonesur,  proteinur,  urobilinogen,  nitrite,  leukocytesur    RADIOGRAPHIC STUDIES: No results found.  ASSESSMENT:  #1.51 y.o. male returns for regular visit for followup of History of stage IVB Hodgkin disease. Presented with bone marrow involvement in 1981, treated at that time with MOPP alternated with ABVD for 12 cycles. He achieved a clinical remission but then relapsed in 1984 with liver and spleen involvement. 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 He also had a liver biopsy. He was then treated with cisplatin, VP-16 and CCNU, the latter alternating with Adriamycin, and he achieved another clinical remission and has been in clinical remission ever since. Await today's lab tests are clinically without evidence of  disease. #2. Hyperlipidemia, on treatment, for repeat lipid panel today.   PLAN:  #1. The patient was complemented on his perseverance. He has father did 60 year old child since his chemotherapy. #2. Influenza virus vaccine #3. Followup in one year with lab tests and physical exam. Patient was told to call should any new symptoms occur that are troublesome and persistent.   All questions were answered. The patient knows to call the clinic with any problems, questions or concerns. We can certainly see the patient much sooner if necessary.   I spent 25 minutes counseling the patient face to face. The total time spent in the appointment was 30 minutes.    Maurilio Lovely, MD 08/07/2013 2:14 PM

## 2013-08-07 NOTE — Progress Notes (Signed)
Hunter Nunez presents today for injection per MD orders. Flu vaccine administered IM in left Upper Arm. Administration without incident. Patient tolerated well.

## 2013-08-07 NOTE — Patient Instructions (Signed)
[facility] Discharge Instructions  RECOMMENDATIONS MADE BY THE CONSULTANT AND ANY TEST RESULTS WILL BE SENT TO YOUR REFERRING PHYSICIAN.  EXAM FINDINGS BY THE PHYSICIAN TODAY AND SIGNS OR SYMPTOMS TO REPORT TO CLINIC OR PRIMARY PHYSICIAN: Exam and findings as discussed by Dr. Zigmund Daniel.  Will check some blood work today and will give you a flu vaccine.  Report night sweats, new lumps, unexplained weight loss, fevers, etc.  MEDICATIONS PRESCRIBED:  none  INSTRUCTIONS/FOLLOW-UP: Return in 1 year with blood work and MD visit.  Thank you for choosing [facility] to provide your oncology and hematology care.  To afford each patient quality time with our providers, please arrive at least 15 minutes before your scheduled appointment time.  With your help, our goal is to use those 15 minutes to complete the necessary work-up to ensure our physicians have the information they need to help with your evaluation and healthcare recommendations.    Effective January 1st, 2014, we ask that you re-schedule your appointment with our physicians should you arrive 10 or more minutes late for your appointment.  We strive to give you quality time with our providers, and arriving late affects you and other patients whose appointments are after yours.    Again, thank you for choosing [facility].  Our hope is that these requests will decrease the amount of time that you wait before being seen by our physicians.       _____________________________________________________________  Should you have questions after your visit to [facility], please contact our office at [phone] between the hours of 8:30 a.m. and 5:00 p.m.  Voicemails left after 4:30 p.m. will not be returned until the following business day.  For prescription refill requests, have your pharmacy contact our office with your prescription refill request.

## 2013-08-10 LAB — BETA 2 MICROGLOBULIN, SERUM: Beta-2 Microglobulin: 1.83 mg/L — ABNORMAL HIGH (ref 1.01–1.73)

## 2013-12-09 ENCOUNTER — Encounter: Payer: Self-pay | Admitting: Cardiology

## 2013-12-18 ENCOUNTER — Encounter: Payer: Self-pay | Admitting: Nurse Practitioner

## 2013-12-18 ENCOUNTER — Ambulatory Visit (INDEPENDENT_AMBULATORY_CARE_PROVIDER_SITE_OTHER): Payer: BC Managed Care – PPO | Admitting: Nurse Practitioner

## 2013-12-18 VITALS — BP 150/90 | HR 56 | Ht 70.0 in | Wt 161.8 lb

## 2013-12-18 DIAGNOSIS — E785 Hyperlipidemia, unspecified: Secondary | ICD-10-CM

## 2013-12-18 MED ORDER — SIMVASTATIN 40 MG PO TABS
40.0000 mg | ORAL_TABLET | Freq: Every day | ORAL | Status: DC
Start: 2013-12-18 — End: 2015-01-20

## 2013-12-18 NOTE — Progress Notes (Signed)
Hunter Nunez Date of Birth: [birth year] Medical Record #[id number]  History of Present Illness: Mr. Hunter Nunez is seen back today for a one year check. Former patient of Dr. Susa Nunez - to establish with Dr. [place]. He has a remote history of Hodgkin's and remains in remission. No known CAD. Negative stress test in 2011. Has had prior splenectomy. Also with HLD and on Zocor.   Last seen here in March of 2014 - was doing ok. BP was elevated at that visit and I asked him to monitor.   Comes in today. Here alone. Needs medicine refilled. He feels good. Has started running and will be running in his first 5K in a couple of weeks. He is doing a program called "Run for [employer]". No chest pain. BP is better at home. He admits he could do better with salt restriction. Energy level is good.   Current Outpatient Prescriptions  Medication Sig Dispense Refill  . acetaminophen (TYLENOL) 325 MG tablet Take 325 mg by mouth as needed.      Marland Kitchen co-enzyme Q-10 30 MG capsule Take 30 mg by mouth daily.      . simvastatin (ZOCOR) 40 MG tablet Take 1 tablet (40 mg total) by mouth at bedtime.  90 tablet  3   No current facility-administered medications for this visit.    Allergies  Allergen Reactions  . Ivp Dye [Iodinated Diagnostic Agents] Hives  . Penicillins Hives  . Metoclopramide Hcl Other (See Comments)    Drawing sensation    Past Medical History  Diagnosis Date  . Hodgkin's disease 1980    AGE 53; followed by Hunter Nunez  . Hyperlipidemia   . Normal nuclear stress test 2011    EF is 53%. No ischemia.  Marland Kitchen History of sinus bradycardia   . Abnormal EKG     Negative stress test in 2011    Past Surgical History  Procedure Laterality Date  . Splenectomy  1980  . Port-a-cath removal  1980    insertion and then removal after chemo  . Cardiovascular stress test  11/28/2009    EF 53%    History  Smoking status  . Never Smoker   Smokeless tobacco  . Never Used    History  Alcohol Use  No    Family History  Problem Relation Age of Onset  . Heart disease Sister   . Diabetes Sister   . Hypertension Sister   . Diabetes Mother     Review of Systems: The review of systems is per the HPI.  All other systems were reviewed and are negative.  Physical Exam: BP 150/90  Pulse 56  Ht 5\' 10"  (1.778 m)  Wt 161 lb 12.8 oz (73.392 kg)  BMI 23.22 kg/m2  SpO2 99% BP by me is 142/88.  Patient is very pleasant and in no acute distress. Skin is warm and dry. Color is normal.  HEENT is unremarkable. Normocephalic/atraumatic. PERRL. Sclera are nonicteric. Neck is supple. No masses. No JVD. Lungs are clear. Cardiac exam shows a regular rate and rhythm. Abdomen is soft. Extremities are without edema. Gait and ROM are intact. No gross neurologic deficits noted.  LABORATORY DATA:  Lab Results  Component Value Date   WBC 8.1 08/07/2013   HGB 13.5 08/07/2013   HCT 41.5 08/07/2013   PLT 344 08/07/2013   GLUCOSE 86 08/07/2013   CHOL 179 08/07/2013   TRIG 73 08/07/2013   HDL 71 08/07/2013   LDLCALC 93 08/07/2013   ALT 31  08/07/2013   AST 29 08/07/2013   NA 137 08/07/2013   K 4.0 08/07/2013   CL 99 08/07/2013   CREATININE 0.96 08/07/2013   BUN 14 08/07/2013   CO2 29 08/07/2013   TSH 2.712 08/03/2011   PSA 0.62 Test Methodology: Hybritech PSA 05/06/2007    Assessment / Plan: 1. HLD - on statin therapy - refilled his medicine today. Check labs in June.   2. Hodgkin's - in remission - followed by oncology - he is going back to see Hunter Nunez in December.   3. Elevated BP - better control at home. He will continue to monitor. Restrict salt.   Overall he is felt to be doing well. Has no symptoms. Exam is negative. See again in one year.   Patient is agreeable to this plan and will call if any problems develop in the interim.   Burtis Junes, RN, [facility] [address] [place], [place]  [address] [phone]

## 2013-12-18 NOTE — Patient Instructions (Addendum)
Stay on your current medicines  I have refilled your Zocor today  Needs lipids in June checked with liver tests  Goal BP is less than 135/85  Minimize your salt use  I will see you in a year  Call the [place] office at [phone] if you have any questions, problems or concerns.

## 2014-02-02 IMAGING — CR DG CHEST 2V
2 series · 2 of 2 positions shown · non-contrast
Comparison: 08/03/2011

CLINICAL DATA: Hodgkin's disease, yearly surveillance

CHEST - 2 VIEW

[view not recorded (1 of 2)]
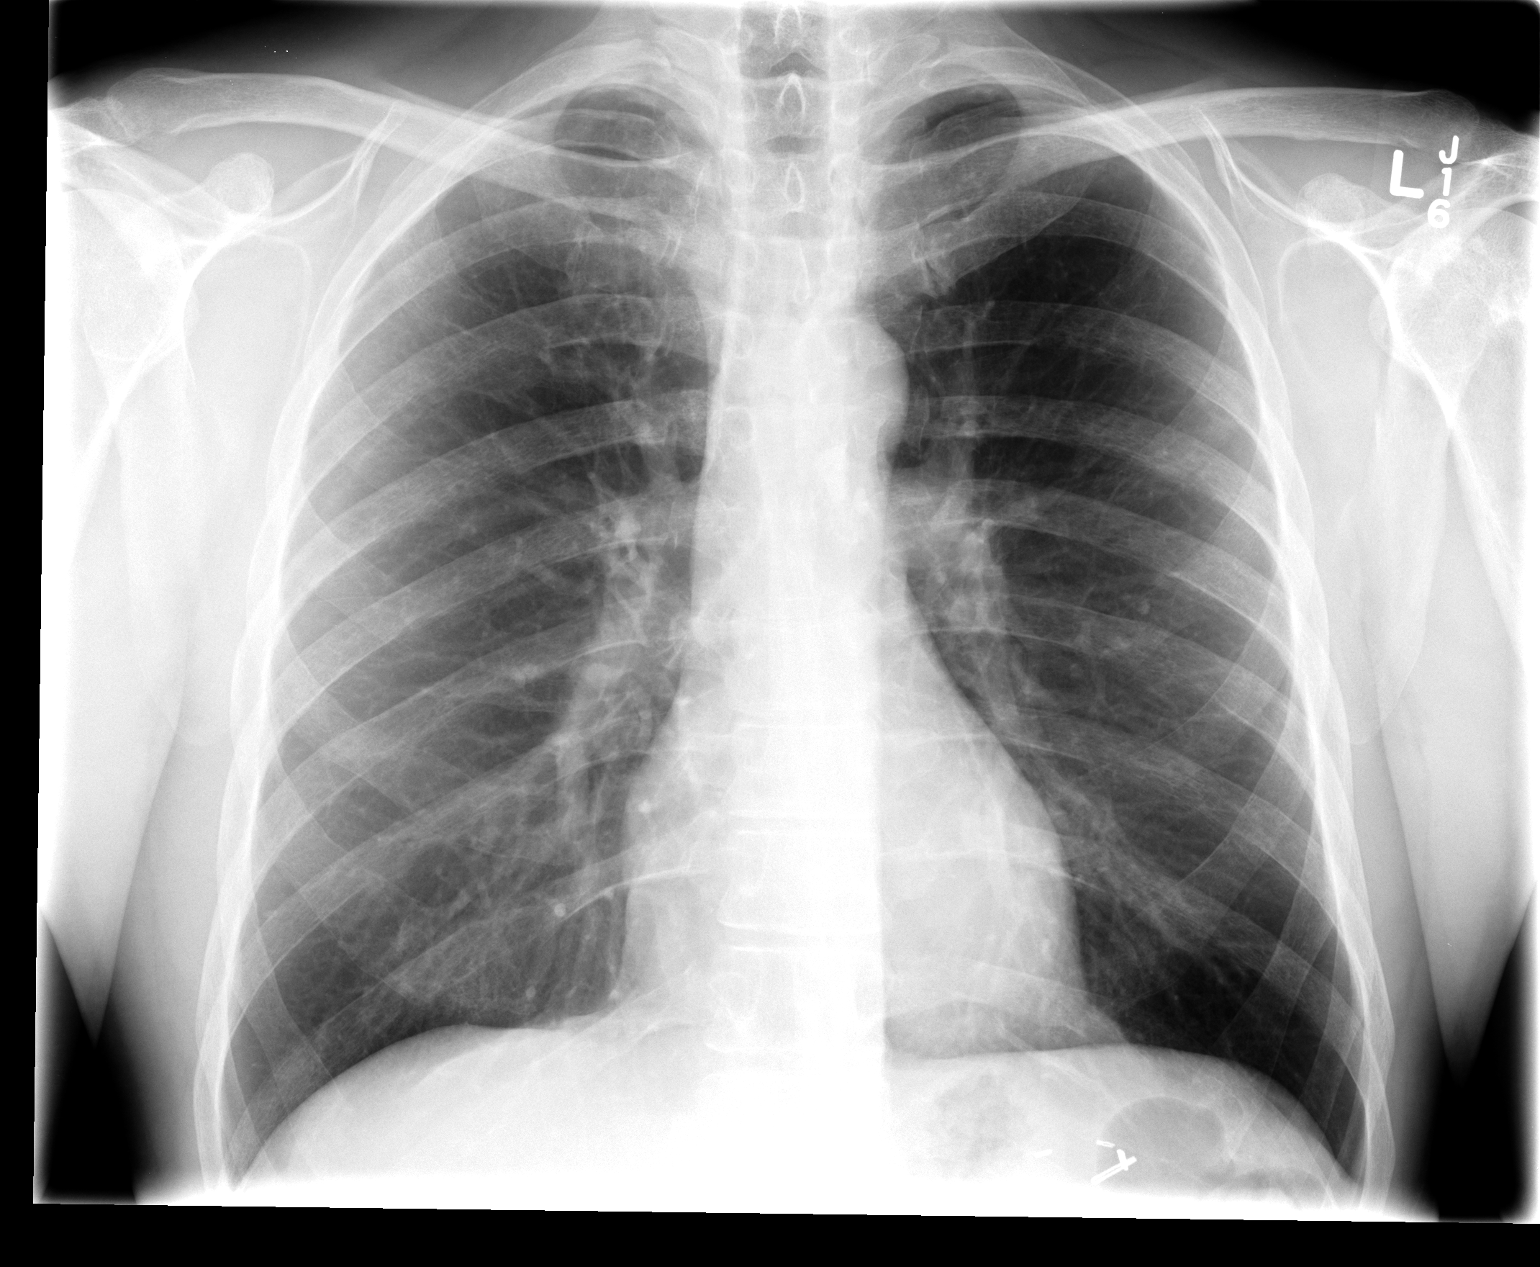

[view not recorded (2 of 2)]
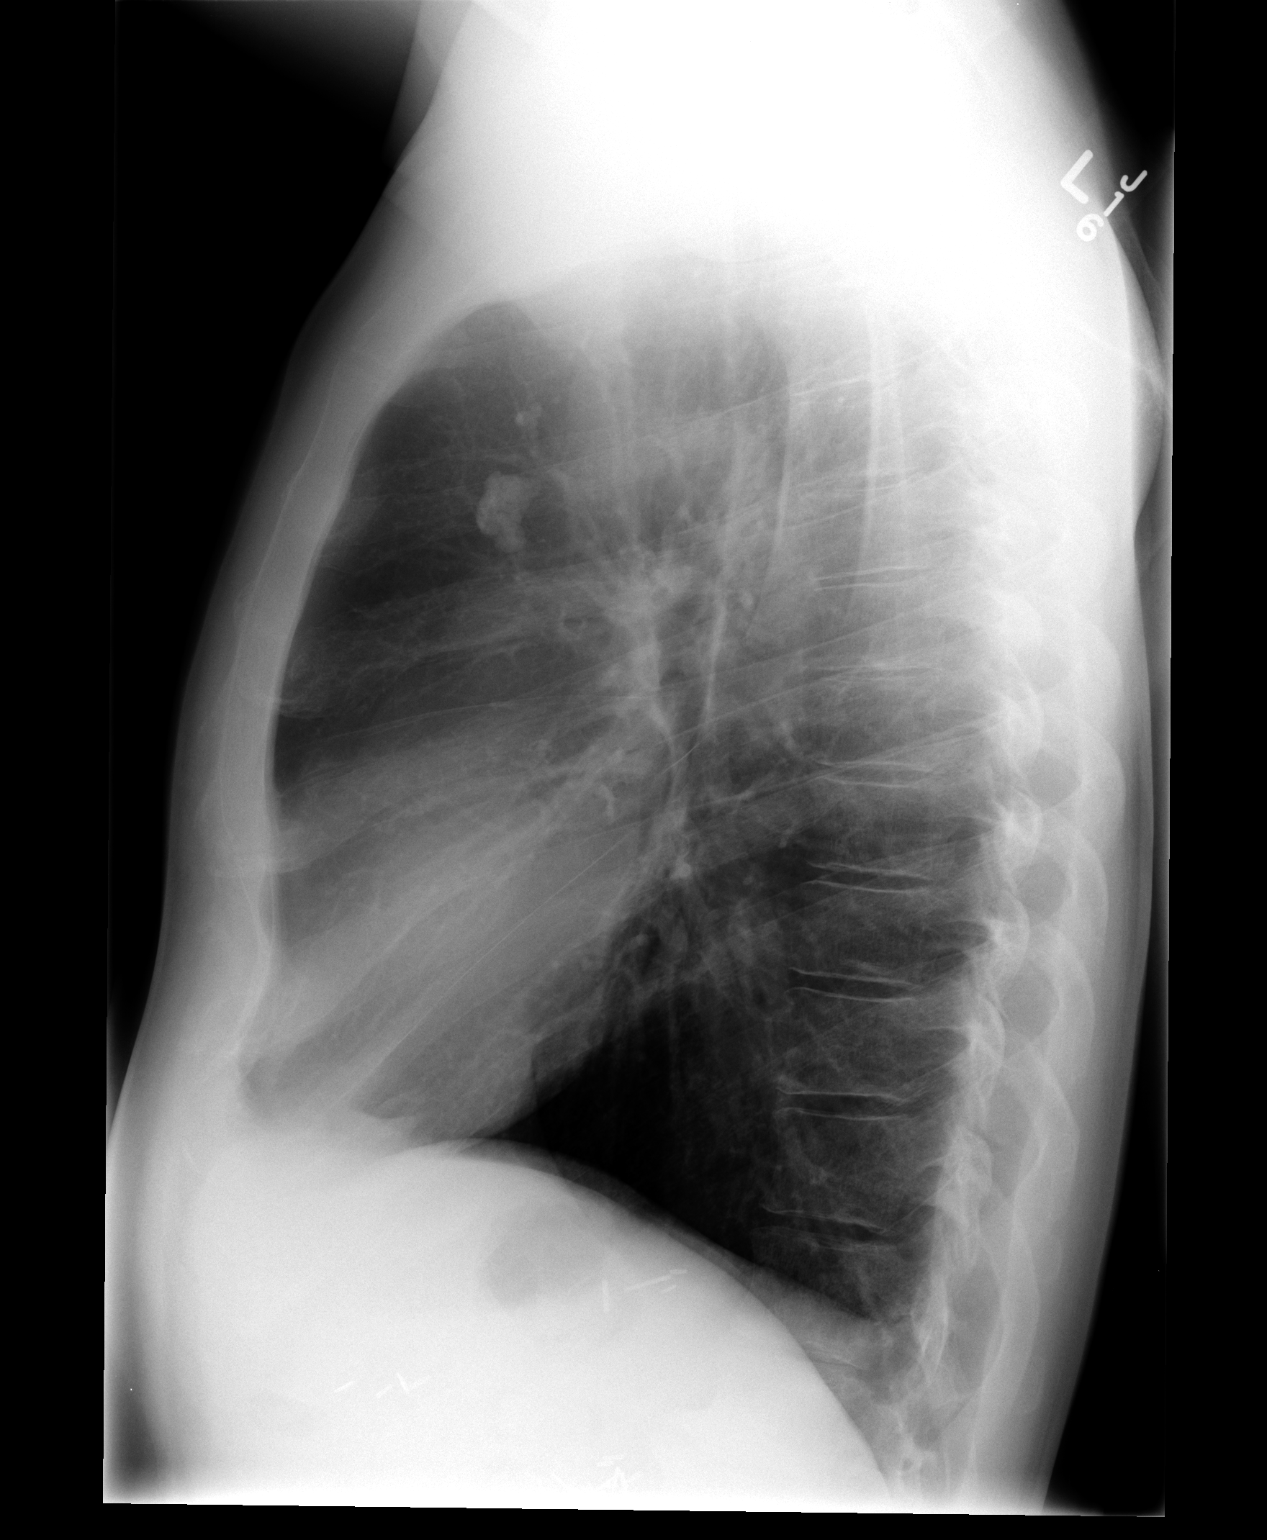

[2 of 2 positions shown; findings below may reference images not displayed]

FINDINGS: Normal heart size, mediastinal contours, and pulmonary vascularity.
Lungs appear emphysematous but clear.
No pleural effusion or pneumothorax.
Calcified plaque at thoracic aorta.
No acute osseous abnormalities.
Surgical clips left upper quadrant.
IMPRESSION: Emphysematous changes.
No acute abnormalities.

## 2014-02-24 ENCOUNTER — Other Ambulatory Visit (INDEPENDENT_AMBULATORY_CARE_PROVIDER_SITE_OTHER): Payer: BC Managed Care – PPO

## 2014-02-24 DIAGNOSIS — E785 Hyperlipidemia, unspecified: Secondary | ICD-10-CM

## 2014-02-24 LAB — BASIC METABOLIC PANEL
BUN: 17 mg/dL (ref 6–23)
CO2: 29 mEq/L (ref 19–32)
Calcium: 9.3 mg/dL (ref 8.4–10.5)
Chloride: 104 mEq/L (ref 96–112)
Creatinine, Ser: 0.9 mg/dL (ref 0.4–1.5)
GFR: 98.14 mL/min (ref >60.00)
Glucose, Bld: 82 mg/dL (ref 70–99)
Potassium: 4.1 mEq/L (ref 3.5–5.1)
Sodium: 140 mEq/L (ref 135–145)

## 2014-02-24 LAB — HEPATIC FUNCTION PANEL
ALT: 20 U/L (ref 0–53)
AST: 22 U/L (ref 0–37)
Albumin: 4.2 g/dL (ref 3.5–5.2)
Alkaline Phosphatase: 58 U/L (ref 39–117)
Bilirubin, Direct: 0.1 mg/dL (ref 0.0–0.3)
Total Bilirubin: 1.1 mg/dL (ref 0.2–1.2)
Total Protein: 7.4 g/dL (ref 6.0–8.3)

## 2014-02-24 LAB — LIPID PANEL
Cholesterol: 154 mg/dL (ref 0–200)
HDL: 56.4 mg/dL (ref >39.00)
LDL Cholesterol: 86 mg/dL (ref 0–99)
NonHDL: 97.6
Total CHOL/HDL Ratio: 3
Triglycerides: 57 mg/dL (ref 0.0–149.0)
VLDL: 11.4 mg/dL (ref 0.0–40.0)

## 2014-03-01 ENCOUNTER — Telehealth: Payer: Self-pay | Admitting: Nurse Practitioner

## 2014-03-01 NOTE — Telephone Encounter (Signed)
Patient would like results of lab work. Please call and advise.

## 2014-03-01 NOTE — Telephone Encounter (Signed)
Pt's wife aware of lab results and I stated already mailed a copy should receive soon

## 2014-08-06 ENCOUNTER — Other Ambulatory Visit (HOSPITAL_COMMUNITY): Payer: BC Managed Care – PPO

## 2014-08-06 ENCOUNTER — Ambulatory Visit (HOSPITAL_COMMUNITY): Payer: BC Managed Care – PPO

## 2014-12-17 ENCOUNTER — Ambulatory Visit: Payer: BC Managed Care – PPO | Admitting: Nurse Practitioner

## 2014-12-21 ENCOUNTER — Ambulatory Visit: Payer: Self-pay | Admitting: Nurse Practitioner

## 2015-01-20 ENCOUNTER — Other Ambulatory Visit: Payer: Self-pay | Admitting: Nurse Practitioner

## 2015-03-03 ENCOUNTER — Other Ambulatory Visit: Payer: Self-pay | Admitting: Nurse Practitioner

## 2015-04-27 ENCOUNTER — Encounter: Payer: Self-pay | Admitting: Nurse Practitioner

## 2015-04-27 ENCOUNTER — Ambulatory Visit (INDEPENDENT_AMBULATORY_CARE_PROVIDER_SITE_OTHER): Payer: BLUE CROSS/BLUE SHIELD | Admitting: Nurse Practitioner

## 2015-04-27 VITALS — BP 136/80 | HR 48 | Ht 70.0 in | Wt 156.8 lb

## 2015-04-27 DIAGNOSIS — E785 Hyperlipidemia, unspecified: Secondary | ICD-10-CM | POA: Diagnosis not present

## 2015-04-27 DIAGNOSIS — R634 Abnormal weight loss: Secondary | ICD-10-CM | POA: Diagnosis not present

## 2015-04-27 LAB — HEPATIC FUNCTION PANEL
ALT: 19 U/L (ref 0–53)
AST: 22 U/L (ref 0–37)
Albumin: 4.1 g/dL (ref 3.5–5.2)
Alkaline Phosphatase: 55 U/L (ref 39–117)
Bilirubin, Direct: 0.1 mg/dL (ref 0.0–0.3)
Total Bilirubin: 0.5 mg/dL (ref 0.2–1.2)
Total Protein: 7.2 g/dL (ref 6.0–8.3)

## 2015-04-27 LAB — LIPID PANEL
Cholesterol: 202 mg/dL — ABNORMAL HIGH (ref 0–200)
HDL: 55.5 mg/dL (ref >39.00)
LDL Cholesterol: 132 mg/dL — ABNORMAL HIGH (ref 0–99)
NonHDL: 146.88
Total CHOL/HDL Ratio: 4
Triglycerides: 72 mg/dL (ref 0.0–149.0)
VLDL: 14.4 mg/dL (ref 0.0–40.0)

## 2015-04-27 LAB — BASIC METABOLIC PANEL
BUN: 11 mg/dL (ref 6–23)
CO2: 27 mEq/L (ref 19–32)
Calcium: 9.4 mg/dL (ref 8.4–10.5)
Chloride: 103 mEq/L (ref 96–112)
Creatinine, Ser: 0.81 mg/dL (ref 0.40–1.50)
GFR: 106.09 mL/min (ref >60.00)
Glucose, Bld: 87 mg/dL (ref 70–99)
Potassium: 4.1 mEq/L (ref 3.5–5.1)
Sodium: 138 mEq/L (ref 135–145)

## 2015-04-27 LAB — TSH: TSH: 2.82 u[IU]/mL (ref 0.35–4.50)

## 2015-04-27 MED ORDER — SIMVASTATIN 40 MG PO TABS: 40.0000 mg | ORAL_TABLET | Freq: Every day | ORAL | Status: DC

## 2015-04-27 NOTE — Progress Notes (Signed)
CARDIOLOGY OFFICE NOTE  Date:  04/27/2015    Hunter Nunez Date of Birth: July 11, 1961 Medical Record #[id number]  PCP:  Hunter [place], MD  Cardiologist:  Servando Snare    Chief Complaint  Patient presents with  . Hyperlipidemia    Annual check - seen for Hunter Nunez.    History of Present Illness: Hunter Nunez is a 54 y.o. male who presents today for his annual visit. Former patient of Dr. Susa Nunez - to establish with Hunter Nunez. He has a remote history of Hodgkin's and remains in remission. No known CAD. Negative stress test in 2011. Chronic abnormal EKG. Chronic bradycardia.  Has had prior splenectomy. Seen for HLD and on Zocor.   Last seen back in April of 2015 by me and was doing ok.   Comes in today. Here alone. He continues to do well. No chest pain. Not short of breath. Not running anymore due to some back issues. Actually had back surgery back in October and did well. Not dizzy or lightheaded. He is pleased with how he is doing.  Past Medical History  Diagnosis Date  . Hodgkin's disease 1980    AGE 3; followed by Dr. Tressie Nunez  . Hyperlipidemia   . Normal nuclear stress test 2011    EF is 53%. No ischemia.  Marland Kitchen History of sinus bradycardia   . Abnormal EKG     Negative stress test in 2011    Past Surgical History  Procedure Laterality Date  . Splenectomy  1980  . Port-a-cath removal  1980    insertion and then removal after chemo  . Cardiovascular stress test  11/28/2009    EF 53%     Medications: Current Outpatient Prescriptions  Medication Sig Dispense Refill  . acetaminophen (TYLENOL) 325 MG tablet Take 325 mg by mouth as needed.    . simvastatin (ZOCOR) 40 MG tablet Take 1 tablet (40 mg total) by mouth at bedtime. 30 tablet 11   No current facility-administered medications for this visit.    Allergies: Allergies  Allergen Reactions  . Ivp Dye [Iodinated Diagnostic Agents] Hives  . Penicillins Hives  . Metoclopramide Hcl Other (See  Comments)    Drawing sensation    Social History: The patient  reports that he has never smoked. He has never used smokeless tobacco. He reports that he does not drink alcohol or use illicit drugs.   Family History: The patient's family history includes Diabetes in his mother and sister; Heart disease in his sister; Hypertension in his sister.   Review of Systems: Please see the history of present illness.   Otherwise, the review of systems is positive for none.   All other systems are reviewed and negative.   Physical Exam: VS:  BP 136/80 mmHg  Pulse 48  Ht 5\' 10"  (1.778 m)  Wt 156 lb 12.8 oz (71.124 kg)  BMI 22.50 kg/m2 .  BMI Body mass index is 22.5 kg/(m^2).  Wt Readings from Last 3 Encounters:  04/27/15 156 lb 12.8 oz (71.124 kg)  12/18/13 161 lb 12.8 oz (73.392 kg)  08/07/13 165 lb (74.844 kg)   BP by me is 124/80.  General: Pleasant. He remains thin.  in no acute distress.  HEENT: Normal. Neck: Supple, no JVD, carotid bruits, or masses noted.  Cardiac: Regular rate and rhythm. No murmurs, rubs, or gallops. No edema.  Respiratory:  Lungs are clear to auscultation bilaterally with normal work of breathing.  GI: Soft and nontender.  MS:  No deformity or atrophy. Gait and ROM intact. Skin: Warm and dry. Color is normal.  Neuro:  Strength and sensation are intact and no gross focal deficits noted.  Psych: Alert, appropriate and with normal affect.   LABORATORY DATA:  EKG:  EKG is ordered today. This demonstrates sinus bradycardia, chronic inferolateral changes with anteroseptal Q's - unchanged.  Lab Results  Component Value Date   WBC 8.1 08/07/2013   HGB 13.5 08/07/2013   HCT 41.5 08/07/2013   PLT 344 08/07/2013   GLUCOSE 82 02/24/2014   CHOL 154 02/24/2014   TRIG 57.0 02/24/2014   HDL 56.40 02/24/2014   LDLCALC 86 02/24/2014   ALT 20 02/24/2014   AST 22 02/24/2014   NA 140 02/24/2014   K 4.1 02/24/2014   CL 104 02/24/2014   CREATININE 0.9 02/24/2014    BUN 17 02/24/2014   CO2 29 02/24/2014   TSH 2.712 08/03/2011   PSA 0.62 Test Methodology: Hy[employer]ch PSA 05/06/2007    BNP (last 3 results) No results for input(s): BNP in the last 8760 hours.  ProBNP (last 3 results) No results for input(s): PROBNP in the last 8760 hours.   Other Studies Reviewed Today: Myoview from 2011 with abnormal EKG, normal perfusion and normal EF.   Assessment/Plan: 1. HLD - on statin therapy - refilled his medicine today. Recheck his labs today.   2. Hodgkin's - in remission - followed by Dr. Tressie Nunez.   3. Chronic bradycardia - he is totally asymptomatic. Will monitor for symptoms and let us know. He is not on rate slowing/AV nodal blocking medicines.   Current medicines are reviewed with the patient today.  The patient does not have concerns regarding medicines other than what has been noted above.  The following changes have been made:  See above.  Labs/ tests ordered today include:    Orders Placed This Encounter  Procedures  . Basic metabolic panel  . Hepatic function panel  . Lipid panel  . TSH  . EKG 12-Lead     Disposition:   FU with me in 1 year with labs.   Patient is agreeable to this plan and will call if any problems develop in the interim.   Signed: Burtis Junes, RN, ANP-C 04/27/2015 9:45 AM  [facility] [address] [place], [place]  [address] Phone: [phone] Fax: [phone]

## 2015-04-27 NOTE — Patient Instructions (Addendum)
We will be checking the following labs today - BMET, TSH, Lipids and HPF   Medication Instructions:    Continue with your current medicines.   I refilled your Zocor today    Testing/Procedures To Be Arranged:  N/A  Follow-Up:   See me in one year    Other Special Instructions:   Keep up the good work  Call me for any problems  Call the [place] office at [phone] if you have any questions, problems or concerns.

## 2015-05-12 ENCOUNTER — Encounter: Payer: Self-pay | Admitting: *Deleted

## 2015-09-09 ENCOUNTER — Encounter: Payer: Self-pay | Admitting: Nurse Practitioner

## 2015-12-07 ENCOUNTER — Encounter: Payer: Self-pay | Admitting: Nurse Practitioner

## 2016-04-20 ENCOUNTER — Encounter (INDEPENDENT_AMBULATORY_CARE_PROVIDER_SITE_OTHER): Payer: Self-pay

## 2016-04-20 ENCOUNTER — Encounter: Payer: Self-pay | Admitting: Nurse Practitioner

## 2016-04-20 ENCOUNTER — Ambulatory Visit (INDEPENDENT_AMBULATORY_CARE_PROVIDER_SITE_OTHER): Payer: BLUE CROSS/BLUE SHIELD | Admitting: Nurse Practitioner

## 2016-04-20 VITALS — BP 126/82 | HR 49 | Ht 70.0 in | Wt 156.0 lb

## 2016-04-20 DIAGNOSIS — E785 Hyperlipidemia, unspecified: Secondary | ICD-10-CM

## 2016-04-20 LAB — BASIC METABOLIC PANEL
BUN: 16 mg/dL (ref 7–25)
CO2: 26 mmol/L (ref 20–31)
Calcium: 9.2 mg/dL (ref 8.6–10.3)
Chloride: 104 mmol/L (ref 98–110)
Creat: 0.99 mg/dL (ref 0.70–1.33)
Glucose, Bld: 76 mg/dL (ref 65–99)
Potassium: 3.5 mmol/L (ref 3.5–5.3)
Sodium: 140 mmol/L (ref 135–146)

## 2016-04-20 LAB — LIPID PANEL
Cholesterol: 148 mg/dL (ref 125–200)
HDL: 72 mg/dL (ref >=40)
LDL Cholesterol: 67 mg/dL (ref <130)
Total CHOL/HDL Ratio: 2.1 Ratio (ref <=5.0)
Triglycerides: 43 mg/dL (ref <150)
VLDL: 9 mg/dL (ref <30)

## 2016-04-20 LAB — HEPATIC FUNCTION PANEL
ALT: 16 U/L (ref 9–46)
AST: 19 U/L (ref 10–35)
Albumin: 4.1 g/dL (ref 3.6–5.1)
Alkaline Phosphatase: 53 U/L (ref 40–115)
Bilirubin, Direct: 0.2 mg/dL (ref <=0.2)
Indirect Bilirubin: 0.6 mg/dL (ref 0.2–1.2)
Total Bilirubin: 0.8 mg/dL (ref 0.2–1.2)
Total Protein: 6.9 g/dL (ref 6.1–8.1)

## 2016-04-20 MED ORDER — SIMVASTATIN 40 MG PO TABS: 40.0000 mg | ORAL_TABLET | Freq: Every day | ORAL | 3 refills | Status: DC

## 2016-04-20 NOTE — Progress Notes (Signed)
CARDIOLOGY OFFICE NOTE  Date:  04/20/2016    Hunter Nunez Date of Birth: 03-19-1961 Medical Record [id number]  PCP:  No PCP Per Patient  Cardiologist:  Servando Snare & [place]    Chief Complaint  Patient presents with  . Hyperlipidemia    One year check. Seen for Dr. Martinique.    History of Present Illness: Hunter Nunez is a 55 y.o. male who presents today for a one year check.. Former patient of Dr. Susa Simmonds - to establish with Dr. Martinique.   He has a remote history of Hodgkin's and remains in remission. No known CAD. Negative stress test in 2011. Chronic abnormal EKG. Chronic bradycardia.  Has had prior splenectomy. Seen for HLD and on Zocor.   Last seen back in August 2016 and was doing well.   Comes in today. Here alone. He continues to do very well. No chest pain. Not short of breath. Not dizzy or lightheaded. Energy level is good. No syncope. He is happy with how he is doing.   Past Medical History:  Diagnosis Date  . Abnormal EKG    Negative stress test in 2011  . History of sinus bradycardia   . Hodgkin's disease 1980   AGE 59; followed by Dr. Tressie Stalker  . Hyperlipidemia   . Normal nuclear stress test 2011   EF is 53%. No ischemia.    Past Surgical History:  Procedure Laterality Date  . CARDIOVASCULAR STRESS TEST  11/28/2009   EF 53%  . PORT-A-CATH REMOVAL  1980   insertion and then removal after chemo  . SPLENECTOMY  1980     Medications: Current Outpatient Prescriptions  Medication Sig Dispense Refill  . acetaminophen (TYLENOL) 325 MG tablet Take 325 mg by mouth as needed.    . simvastatin (ZOCOR) 40 MG tablet Take 1 tablet (40 mg total) by mouth at bedtime. 90 tablet 3   No current facility-administered medications for this visit.     Allergies: Allergies  Allergen Reactions  . Ivp Dye [Iodinated Diagnostic Agents] Hives  . Penicillins Hives  . Metoclopramide Hcl Other (See Comments)    Drawing sensation    Social  History: The patient  reports that he has never smoked. He has never used smokeless tobacco. He reports that he does not drink alcohol or use drugs.   Family History: The patient's family history includes Diabetes in his mother and sister; Heart disease in his sister; Hypertension in his sister.   Review of Systems: Please see the history of present illness.   Otherwise, the review of systems is positive for none.   All other systems are reviewed and negative.   Physical Exam: VS:  BP 126/82 (BP Location: Left Arm, Patient Position: Sitting, Cuff Size: Normal)   Pulse (!) 49   Ht 5\' 10"  (1.778 m)   Wt 156 lb (70.8 kg)   BMI 22.38 kg/m  .  BMI Body mass index is 22.38 kg/m.  Wt Readings from Last 3 Encounters:  04/20/16 156 lb (70.8 kg)  04/27/15 156 lb 12.8 oz (71.1 kg)  12/18/13 161 lb 12.8 oz (73.4 kg)    General: Pleasant. Well developed, well nourished and in no acute distress.   HEENT: Normal.  Neck: Supple, no JVD, carotid bruits, or masses noted.  Cardiac: Regular rate and rhythm. No murmurs, rubs, or gallops. No edema.  Respiratory:  Lungs are clear to auscultation bilaterally with normal work of breathing.  GI: Soft and nontender.  MS: No  deformity or atrophy. Gait and ROM intact.  Skin: Warm and dry. Color is normal.  Neuro:  Strength and sensation are intact and no gross focal deficits noted.  Psych: Alert, appropriate and with normal affect.   LABORATORY DATA:  EKG:  EKG is ordered today. This demonstrates sinus bradycardia with anterolateral Q's - tracing is unchanged.  Lab Results  Component Value Date   WBC 8.1 08/07/2013   HGB 13.5 08/07/2013   HCT 41.5 08/07/2013   PLT 344 08/07/2013   GLUCOSE 87 04/27/2015   CHOL 202 (H) 04/27/2015   TRIG 72.0 04/27/2015   HDL 55.50 04/27/2015   LDLCALC 132 (H) 04/27/2015   ALT 19 04/27/2015   AST 22 04/27/2015   NA 138 04/27/2015   K 4.1 04/27/2015   CL 103 04/27/2015   CREATININE 0.81 04/27/2015   BUN 11  04/27/2015   CO2 27 04/27/2015   TSH 2.82 04/27/2015   PSA 0.62 Test Methodology: Hybr[employer]ch PSA 05/06/2007    BNP (last 3 results) No results for input(s): BNP in the last 8760 hours.  ProBNP (last 3 results) No results for input(s): PROBNP in the last 8760 hours.   Other Studies Reviewed Today:   Assessment/Plan: 1. HLD - on statin therapy - refilled his medicine today. Recheck his labs today. I refilled his Zocor today.   2. Hodgkin's - in remission - followed by Dr. Tressie Stalker.   3. Chronic bradycardia/chronic abnormal EKG - he is totally asymptomatic. Will monitor for symptoms and let us know. He is not on rate slowing/AV nodal blocking medicines.   Current medicines are reviewed with the patient today.  The patient does not have concerns regarding medicines other than what has been noted above.  The following changes have been made:  See above.  Labs/ tests ordered today include:    Orders Placed This Encounter  Procedures  . Basic metabolic panel  . Hepatic function panel  . Lipid panel  . EKG 12-Lead     Disposition:   FU with me in 1 year.   Patient is agreeable to this plan and will call if any problems develop in the interim.   Signed: Burtis Junes, RN, ANP-C 04/20/2016 8:52 AM  [facility] [address] [place], [place]  [address] Phone: [phone] Fax: [phone]

## 2016-04-20 NOTE — Patient Instructions (Signed)
We will be checking the following labs today - BMET, Lipids and HPF   Medication Instructions:    Continue with your current medicines.     Testing/Procedures To Be Arranged:  N/A  Follow-Up:   See me in one year.     Other Special Instructions:   N/A    If you need a refill on your cardiac medications before your next appointment, please call your pharmacy.   Call the [place] office at [phone] if you have any questions, problems or concerns.

## 2016-04-25 ENCOUNTER — Ambulatory Visit: Payer: BLUE CROSS/BLUE SHIELD | Admitting: Nurse Practitioner

## 2016-10-16 DIAGNOSIS — C8190 Hodgkin lymphoma, unspecified, unspecified site: Secondary | ICD-10-CM | POA: Diagnosis not present

## 2016-10-16 DIAGNOSIS — Z9889 Other specified postprocedural states: Secondary | ICD-10-CM | POA: Diagnosis not present

## 2016-10-16 DIAGNOSIS — R251 Tremor, unspecified: Secondary | ICD-10-CM | POA: Diagnosis not present

## 2016-10-16 DIAGNOSIS — E7800 Pure hypercholesterolemia, unspecified: Secondary | ICD-10-CM | POA: Diagnosis not present

## 2016-10-16 DIAGNOSIS — Z9081 Acquired absence of spleen: Secondary | ICD-10-CM | POA: Diagnosis not present

## 2016-10-29 ENCOUNTER — Telehealth: Payer: Self-pay | Admitting: *Deleted

## 2016-10-29 NOTE — Telephone Encounter (Signed)
S/w pt's wife per DPR received records from [place], Cecille Rubin reviewed.  Everything is ok.

## 2017-01-10 DIAGNOSIS — G250 Essential tremor: Secondary | ICD-10-CM | POA: Diagnosis not present

## 2017-05-28 ENCOUNTER — Ambulatory Visit (INDEPENDENT_AMBULATORY_CARE_PROVIDER_SITE_OTHER): Payer: BLUE CROSS/BLUE SHIELD | Admitting: Nurse Practitioner

## 2017-05-28 ENCOUNTER — Encounter (INDEPENDENT_AMBULATORY_CARE_PROVIDER_SITE_OTHER): Payer: Self-pay

## 2017-05-28 ENCOUNTER — Encounter: Payer: Self-pay | Admitting: Nurse Practitioner

## 2017-05-28 VITALS — BP 148/86 | HR 58 | Ht 70.0 in | Wt 158.4 lb

## 2017-05-28 DIAGNOSIS — E7800 Pure hypercholesterolemia, unspecified: Secondary | ICD-10-CM | POA: Diagnosis not present

## 2017-05-28 DIAGNOSIS — R001 Bradycardia, unspecified: Secondary | ICD-10-CM | POA: Diagnosis not present

## 2017-05-28 DIAGNOSIS — R9431 Abnormal electrocardiogram [ECG] [EKG]: Secondary | ICD-10-CM | POA: Diagnosis not present

## 2017-05-28 LAB — LIPID PANEL
Chol/HDL Ratio: 2.8 ratio (ref 0.0–5.0)
Cholesterol, Total: 174 mg/dL (ref 100–199)
HDL: 62 mg/dL (ref >39)
LDL Calculated: 101 mg/dL — ABNORMAL HIGH (ref 0–99)
Triglycerides: 55 mg/dL (ref 0–149)
VLDL Cholesterol Cal: 11 mg/dL (ref 5–40)

## 2017-05-28 LAB — BASIC METABOLIC PANEL
BUN/Creatinine Ratio: 11 (ref 9–20)
BUN: 10 mg/dL (ref 6–24)
CO2: 25 mmol/L (ref 20–29)
Calcium: 9.6 mg/dL (ref 8.7–10.2)
Chloride: 100 mmol/L (ref 96–106)
Creatinine, Ser: 0.91 mg/dL (ref 0.76–1.27)
GFR calc Af Amer: 110 mL/min/{1.73_m2} (ref >59)
GFR calc non Af Amer: 95 mL/min/{1.73_m2} (ref >59)
Glucose: 89 mg/dL (ref 65–99)
Potassium: 4.6 mmol/L (ref 3.5–5.2)
Sodium: 140 mmol/L (ref 134–144)

## 2017-05-28 LAB — HEPATIC FUNCTION PANEL
ALT: 19 IU/L (ref 0–44)
AST: 24 IU/L (ref 0–40)
Albumin: 4.3 g/dL (ref 3.5–5.5)
Alkaline Phosphatase: 70 IU/L (ref 39–117)
Bilirubin Total: 0.5 mg/dL (ref 0.0–1.2)
Bilirubin, Direct: 0.12 mg/dL (ref 0.00–0.40)
Total Protein: 7.2 g/dL (ref 6.0–8.5)

## 2017-05-28 MED ORDER — SIMVASTATIN 40 MG PO TABS
40.0000 mg | ORAL_TABLET | Freq: Every day | ORAL | 3 refills | Status: DC
Start: 2017-05-28 — End: 2018-05-27

## 2017-05-28 NOTE — Progress Notes (Signed)
CARDIOLOGY OFFICE NOTE  Date:  05/28/2017    Hunter Nunez Date of Birth: 01/19/1961 Medical Record #[id number]  PCP:  Patient, No Pcp Per  Cardiologist:  Servando Snare & [place]    Chief Complaint  Patient presents with  . Coronary Artery Disease    1 year check - seen for Dr. Martinique    History of Present Illness: Hunter Nunez is a 56 y.o. male who presents today for a one year check.. Former patient of Dr. Susa Simmonds - to establish with Dr. Martinique but has basically followed with me.   He has a remote history of Hodgkin's and remains in remission. No known CAD.  Chronic abnormal EKG with anteroseptal Q's. Negative stress test in 2011.Chronic bradycardia. Has had prior splenectomy. Seen for HLD and on Zocor.   Last seen back in August 2017 and was doing well. Has developed a benign essential tremor - saw neurology this past spring.   Comes in today. Here alone. Doing well. BP creeping up however. He feels good. No chest pain. Not short of breath.  Needs Zocor refilled. He gets a little too much salt but really his diet is not that bad. He remains fairly active.   Past Medical History:  Diagnosis Date  . Abnormal EKG    Negative stress test in 2011  . History of sinus bradycardia   . Hodgkin's disease 1980   AGE 43; followed by Dr. Tressie Stalker  . Hyperlipidemia   . Normal nuclear stress test 2011   EF is 53%. No ischemia.    Past Surgical History:  Procedure Laterality Date  . CARDIOVASCULAR STRESS TEST  11/28/2009   EF 53%  . PORT-A-CATH REMOVAL  1980   insertion and then removal after chemo  . SPLENECTOMY  1980     Medications: Current Meds  Medication Sig  . acetaminophen (TYLENOL) 325 MG tablet Take 325 mg by mouth as needed.  . simvastatin (ZOCOR) 40 MG tablet Take 1 tablet (40 mg total) by mouth at bedtime.  . [DISCONTINUED] simvastatin (ZOCOR) 40 MG tablet Take 1 tablet (40 mg total) by mouth at bedtime.     Allergies: Allergies  Allergen  Reactions  . Ivp Dye [Iodinated Diagnostic Agents] Hives  . Penicillins Hives  . Metoclopramide Hcl Other (See Comments)    Drawing sensation    Social History: The patient  reports that he has never smoked. He has never used smokeless tobacco. He reports that he does not drink alcohol or use drugs.   Family History: The patient's family history includes Diabetes in his mother and sister; Heart disease in his sister; Hypertension in his sister.   Review of Systems: Please see the history of present illness.   Otherwise, the review of systems is positive for none.   All other systems are reviewed and negative.   Physical Exam: VS:  BP (!) 148/86 (BP Location: Left Arm, Patient Position: Sitting, Cuff Size: Normal)   Pulse (!) 58   Ht 5\' 10"  (1.778 m)   Wt 158 lb 6.4 oz (71.8 kg)   BMI 22.73 kg/m  .  BMI Body mass index is 22.73 kg/m.  Wt Readings from Last 3 Encounters:  05/28/17 158 lb 6.4 oz (71.8 kg)  04/20/16 156 lb (70.8 kg)  04/27/15 156 lb 12.8 oz (71.1 kg)   BP is 160/100 in both arms by me.   General: Pleasant. Well developed, well nourished and in no acute distress.   HEENT: Normal.  Neck: Supple, no JVD, carotid bruits, or masses noted.  Cardiac: Regular rate and rhythm. No murmurs, rubs, or gallops. No edema.  Respiratory:  Lungs are clear to auscultation bilaterally with normal work of breathing.  GI: Soft and nontender.  MS: No deformity or atrophy. Gait and ROM intact.  Skin: Warm and dry. Color is normal.  Neuro:  Strength and sensation are intact and no gross focal deficits noted.  Psych: Alert, appropriate and with normal affect.   LABORATORY DATA:  EKG:  EKG is ordered today. This demonstrates sinus brady - HR of 58 - anteroseptal Q's and LAFB - unchanged.  Lab Results  Component Value Date   WBC 8.1 08/07/2013   HGB 13.5 08/07/2013   HCT 41.5 08/07/2013   PLT 344 08/07/2013   GLUCOSE 76 04/20/2016   CHOL 148 04/20/2016   TRIG 43 04/20/2016    HDL 72 04/20/2016   LDLCALC 67 04/20/2016   ALT 16 04/20/2016   AST 19 04/20/2016   NA 140 04/20/2016   K 3.5 04/20/2016   CL 104 04/20/2016   CREATININE 0.99 04/20/2016   BUN 16 04/20/2016   CO2 26 04/20/2016   TSH 2.82 04/27/2015   PSA 0.62 Test Methodology: Hybritech PSA 05/06/2007     BNP (last 3 results) No results for input(s): BNP in the last 8760 hours.  ProBNP (last 3 results) No results for input(s): PROBNP in the last 8760 hours.   Other Studies Reviewed Today:   Assessment/Plan:  1. HLD - on statin therapy - refilled his medicine today. Recheck his labs today.  2. Hodgkin's - in remission - followed by Dr. Tressie Stalker. Never had radiation - just chemo.   3. Chronic bradycardia/chronic abnormal EKG for many years - he remains totally asymptomatic. Will monitor for symptoms and let us know. He is not on rate slowing/AV nodal blocking medicines.   4. HTN - BP is up - he wants to try and cut back his salt intake - he will monitor his BP over the next few weeks and let me know - goal is 135/85. If not at goal would start ACE and bring back for repeat lab and recheck.   5. Tremor - has seen neurology - felt to be benign.   Current medicines are reviewed with the patient today.  The patient does not have concerns regarding medicines other than what has been noted above.  The following changes have been made:  See above.  Labs/ tests ordered today include:    Orders Placed This Encounter  Procedures  . Basic metabolic panel  . Hepatic function panel  . Lipid panel  . EKG 12-Lead     Disposition:   FU with me in 1 year unless we add antihypertensives.   Patient is agreeable to this plan and will call if any problems develop in the interim.   SignedTruitt Merle, NP  05/28/2017 8:26 AM  [facility] [address] [place], [place]  [address] Phone: [phone] Fax: [phone]

## 2017-05-28 NOTE — Patient Instructions (Addendum)
We will be checking the following labs today - BMET, HPF and lipids   Medication Instructions:    Continue with your current medicines.     Testing/Procedures To Be Arranged:  N/A  Follow-Up:   See me in one year. We will bring you back sooner if we end up starting you on BP medicines.     Other Special Instructions:   Monitor your BP for me at home - about 2 times a day - keep a record and let me know in about 2 weeks (sooner if needed) - ok to send My Chart message. Goal BP is 135/85 or less.   Avoid salt    If you need a refill on your cardiac medications before your next appointment, please call your pharmacy.   Call the [place] office at [phone] if you have any questions, problems or concerns.

## 2017-07-01 DIAGNOSIS — Z23 Encounter for immunization: Secondary | ICD-10-CM | POA: Diagnosis not present

## 2017-10-22 DIAGNOSIS — R251 Tremor, unspecified: Secondary | ICD-10-CM | POA: Diagnosis not present

## 2017-10-22 DIAGNOSIS — D509 Iron deficiency anemia, unspecified: Secondary | ICD-10-CM | POA: Diagnosis not present

## 2017-10-22 DIAGNOSIS — C8121 Mixed cellularity classical Hodgkin lymphoma, lymph nodes of head, face, and neck: Secondary | ICD-10-CM | POA: Diagnosis not present

## 2017-10-22 DIAGNOSIS — R718 Other abnormality of red blood cells: Secondary | ICD-10-CM | POA: Diagnosis not present

## 2017-10-22 DIAGNOSIS — C8190 Hodgkin lymphoma, unspecified, unspecified site: Secondary | ICD-10-CM | POA: Diagnosis not present

## 2017-10-22 DIAGNOSIS — Z9081 Acquired absence of spleen: Secondary | ICD-10-CM | POA: Diagnosis not present

## 2017-11-24 DIAGNOSIS — K529 Noninfective gastroenteritis and colitis, unspecified: Secondary | ICD-10-CM | POA: Diagnosis not present

## 2017-12-27 DIAGNOSIS — C8128 Mixed cellularity classical Hodgkin lymphoma, lymph nodes of multiple sites: Secondary | ICD-10-CM | POA: Diagnosis not present

## 2018-05-27 ENCOUNTER — Ambulatory Visit: Payer: BLUE CROSS/BLUE SHIELD | Admitting: Nurse Practitioner

## 2018-05-27 ENCOUNTER — Encounter: Payer: Self-pay | Admitting: Nurse Practitioner

## 2018-05-27 VITALS — BP 170/100 | HR 55 | Ht 70.0 in | Wt 155.4 lb

## 2018-05-27 DIAGNOSIS — E7800 Pure hypercholesterolemia, unspecified: Secondary | ICD-10-CM

## 2018-05-27 DIAGNOSIS — R9431 Abnormal electrocardiogram [ECG] [EKG]: Secondary | ICD-10-CM

## 2018-05-27 DIAGNOSIS — R001 Bradycardia, unspecified: Secondary | ICD-10-CM | POA: Diagnosis not present

## 2018-05-27 DIAGNOSIS — I480 Paroxysmal atrial fibrillation: Secondary | ICD-10-CM | POA: Diagnosis not present

## 2018-05-27 DIAGNOSIS — I471 Supraventricular tachycardia: Secondary | ICD-10-CM | POA: Diagnosis not present

## 2018-05-27 MED ORDER — SIMVASTATIN 40 MG PO TABS: 40.0000 mg | ORAL_TABLET | Freq: Every day | ORAL | 3 refills | Status: DC

## 2018-05-27 NOTE — Progress Notes (Signed)
CARDIOLOGY OFFICE NOTE  Date:  05/27/2018    Hunter Nunez Date of Birth: [birth year] Medical Record #[id number]  PCP:  Patient, No Pcp Per  Cardiologist:  Servando Snare    Chief Complaint  Patient presents with  . Hypertension  . Hyperlipidemia    1 year check.     History of Present Illness: Hunter Nunez is a 57 y.o. male who presents today for a one year check. Former patient of Dr. Susa Simmonds - to establish with Dr. Martinique but has basically followed with me.   He has a remote history of Hodgkin's and remains in remission. No known CAD.  Chronic abnormal EKG with anteroseptal Q's. Negative stress test in 2011.Chronic bradycardia. Has had prior splenectomy. Seen for HLD and on Zocor. He has a benign tremor.   Last seen back in September of 2018 - was doing well but BP was creeping up - most likely salt related. He was to monitor.   Comes in today. Here alone. He feels well. Has had a good year. No chest pain. Breathing is fine. Not dizzy or lightheaded. No palpitations. He has no real concerns. Does need his Zocor refilled today. His BP at home is much lower - typically in the 130/70 range. He has cut back his salt.   Past Medical History:  Diagnosis Date  . Abnormal EKG    Negative stress test in 2011  . History of sinus bradycardia   . Hodgkin's disease 1980   AGE 76; followed by Dr. Tressie Stalker  . Hyperlipidemia   . Normal nuclear stress test 2011   EF is 53%. No ischemia.    Past Surgical History:  Procedure Laterality Date  . CARDIOVASCULAR STRESS TEST  11/28/2009   EF 53%  . PORT-A-CATH REMOVAL  1980   insertion and then removal after chemo  . SPLENECTOMY  1980     Medications: Current Meds  Medication Sig  . acetaminophen (TYLENOL) 325 MG tablet Take 325 mg by mouth as needed.  . simvastatin (ZOCOR) 40 MG tablet Take 1 tablet (40 mg total) by mouth at bedtime.  . [DISCONTINUED] simvastatin (ZOCOR) 40 MG tablet Take 1 tablet (40 mg total) by  mouth at bedtime.     Allergies: Allergies  Allergen Reactions  . Ivp Dye [Iodinated Diagnostic Agents] Hives  . Penicillins Hives  . Metoclopramide Hcl Other (See Comments)    Drawing sensation    Social History: The patient  reports that he has never smoked. He has never used smokeless tobacco. He reports that he does not drink alcohol or use drugs.   Family History: The patient's family history includes Diabetes in his mother and sister; Heart disease in his sister; Hypertension in his sister.   Review of Systems: Please see the history of present illness.   Otherwise, the review of systems is positive for none.   All other systems are reviewed and negative.   Physical Exam: VS:  BP (!) 170/100 (BP Location: Left Arm, Patient Position: Sitting, Cuff Size: Normal)   Pulse (!) 55   Ht 5\' 10"  (1.778 m)   Wt 155 lb 6.4 oz (70.5 kg)   BMI 22.30 kg/m  .  BMI Body mass index is 22.3 kg/m.  Wt Readings from Last 3 Encounters:  05/27/18 155 lb 6.4 oz (70.5 kg)  05/28/17 158 lb 6.4 oz (71.8 kg)  04/20/16 156 lb (70.8 kg)   Repeat BP by me is 160/80  General: Pleasant. Well developed, well  nourished and in no acute distress.   HEENT: Normal.  Neck: Supple, no JVD, carotid bruits, or masses noted.  Cardiac: Regular rate and rhythm. No murmurs, rubs, or gallops. No edema.  Respiratory:  Lungs are clear to auscultation bilaterally with normal work of breathing.  GI: Soft and nontender.  MS: No deformity or atrophy. Gait and ROM intact.  Skin: Warm and dry. Color is normal.  Neuro:  Strength and sensation are intact and no gross focal deficits noted.  Psych: Alert, appropriate and with normal affect.   LABORATORY DATA:  EKG:  EKG is ordered today. This demonstrates ?SVT versus atrial fibrillation with conversion to sinus bradycardia. Reviewed with Dr. Lovena Le (DOD)  Lab Results  Component Value Date   WBC 8.1 08/07/2013   HGB 13.5 08/07/2013   HCT 41.5 08/07/2013   PLT  344 08/07/2013   GLUCOSE 89 05/28/2017   CHOL 174 05/28/2017   TRIG 55 05/28/2017   HDL 62 05/28/2017   LDLCALC 101 (H) 05/28/2017   ALT 19 05/28/2017   AST 24 05/28/2017   NA 140 05/28/2017   K 4.6 05/28/2017   CL 100 05/28/2017   CREATININE 0.91 05/28/2017   BUN 10 05/28/2017   CO2 25 05/28/2017   TSH 2.82 04/27/2015   PSA 0.62 Test Methodology: Hybritech PSA 05/06/2007     BNP (last 3 results) No results for input(s): BNP in the last 8760 hours.  ProBNP (last 3 results) No results for input(s): PROBNP in the last 8760 hours.   Other Studies Reviewed Today:   Assessment/Plan:  1. ?SVT versus AF with RVR with subsequent conversion to sinus bradycardia - reviewed with Dr. Lovena Le - will arrange for event monitor and echocardiogram along with labs. Further disposition to follow. He is totally asymptomatic. I have asked him to let me know if he has symptoms of palpitations/presyncope.   2. HLD - on stain - refilled today. Lab today  3. Abnormal EKG with chronic bradycardia - see #1  4. Hodgkin's - in remission. Treated with chemo - no radiation.   5. HTN - some component of white coat syndrome - he has much better outpatient control. I have asked him to continue to monitor.   Current medicines are reviewed with the patient today.  The patient does not have concerns regarding medicines other than what has been noted above.  The following changes have been made:  See above.  Labs/ tests ordered today include:    Orders Placed This Encounter  Procedures  . Basic metabolic panel  . CBC  . Hepatic function panel  . Lipid panel  . TSH  . CARDIAC EVENT MONITOR  . EKG 12-Lead  . ECHOCARDIOGRAM COMPLETE     Disposition:   FU with me in 6 months.   Patient is agreeable to this plan and will call if any problems develop in the interim.   SignedTruitt Merle, NP  05/27/2018 8:18 AM  [facility] [address] [place], [place]  [address] Phone: [phone] Fax: [phone]

## 2018-05-27 NOTE — Patient Instructions (Addendum)
We will be checking the following labs today - BMET, CBC, HPF, Lipids and TSH   Medication Instructions:    Continue with your current medicines.   I refilled the Zocor today    Testing/Procedures To Be Arranged:  Echocardiogram   Event monitor  Follow-Up:   See me in 6 months    Other Special Instructions:   Your EKG today showed possible SVT or atrial fibrillation   Monitor your BP for me    If you need a refill on your cardiac medications before your next appointment, please call your pharmacy.   Call the [place] office at [phone] if you have any questions, problems or concerns.

## 2018-05-28 ENCOUNTER — Other Ambulatory Visit: Payer: Self-pay

## 2018-05-28 ENCOUNTER — Ambulatory Visit (HOSPITAL_COMMUNITY): Payer: BLUE CROSS/BLUE SHIELD | Attending: Cardiology

## 2018-05-28 DIAGNOSIS — I471 Supraventricular tachycardia: Secondary | ICD-10-CM

## 2018-05-28 DIAGNOSIS — R9431 Abnormal electrocardiogram [ECG] [EKG]: Secondary | ICD-10-CM | POA: Insufficient documentation

## 2018-05-28 DIAGNOSIS — I080 Rheumatic disorders of both mitral and aortic valves: Secondary | ICD-10-CM | POA: Diagnosis not present

## 2018-05-28 DIAGNOSIS — I480 Paroxysmal atrial fibrillation: Secondary | ICD-10-CM | POA: Diagnosis not present

## 2018-05-28 DIAGNOSIS — E785 Hyperlipidemia, unspecified: Secondary | ICD-10-CM | POA: Diagnosis not present

## 2018-05-28 DIAGNOSIS — I4891 Unspecified atrial fibrillation: Secondary | ICD-10-CM | POA: Diagnosis present

## 2018-05-28 LAB — LIPID PANEL
Chol/HDL Ratio: 3.1 ratio (ref 0.0–5.0)
Cholesterol, Total: 177 mg/dL (ref 100–199)
HDL: 57 mg/dL (ref >39)
LDL Calculated: 107 mg/dL — ABNORMAL HIGH (ref 0–99)
Triglycerides: 67 mg/dL (ref 0–149)
VLDL Cholesterol Cal: 13 mg/dL (ref 5–40)

## 2018-05-28 LAB — BASIC METABOLIC PANEL
BUN/Creatinine Ratio: 16 (ref 9–20)
BUN: 16 mg/dL (ref 6–24)
CO2: 24 mmol/L (ref 20–29)
Calcium: 9.7 mg/dL (ref 8.7–10.2)
Chloride: 100 mmol/L (ref 96–106)
Creatinine, Ser: 0.97 mg/dL (ref 0.76–1.27)
GFR calc Af Amer: 101 mL/min/{1.73_m2} (ref >59)
GFR calc non Af Amer: 88 mL/min/{1.73_m2} (ref >59)
Glucose: 91 mg/dL (ref 65–99)
Potassium: 5.1 mmol/L (ref 3.5–5.2)
Sodium: 139 mmol/L (ref 134–144)

## 2018-05-28 LAB — CBC
Hematocrit: 40.9 % (ref 37.5–51.0)
Hemoglobin: 13 g/dL (ref 13.0–17.7)
MCH: 23.6 pg — ABNORMAL LOW (ref 26.6–33.0)
MCHC: 31.8 g/dL (ref 31.5–35.7)
MCV: 74 fL — ABNORMAL LOW (ref 79–97)
Platelets: 339 10*3/uL (ref 150–450)
RBC: 5.5 x10E6/uL (ref 4.14–5.80)
RDW: 15.6 % — ABNORMAL HIGH (ref 12.3–15.4)
WBC: 8 10*3/uL (ref 3.4–10.8)

## 2018-05-28 LAB — HEPATIC FUNCTION PANEL
ALT: 19 IU/L (ref 0–44)
AST: 23 IU/L (ref 0–40)
Albumin: 4.5 g/dL (ref 3.5–5.5)
Alkaline Phosphatase: 68 IU/L (ref 39–117)
Bilirubin Total: 0.3 mg/dL (ref 0.0–1.2)
Bilirubin, Direct: 0.09 mg/dL (ref 0.00–0.40)
Total Protein: 7.2 g/dL (ref 6.0–8.5)

## 2018-05-28 LAB — TSH: TSH: 5.1 u[IU]/mL — ABNORMAL HIGH (ref 0.450–4.500)

## 2018-05-29 ENCOUNTER — Telehealth: Payer: Self-pay | Admitting: *Deleted

## 2018-05-29 ENCOUNTER — Other Ambulatory Visit: Payer: Self-pay | Admitting: *Deleted

## 2018-05-29 ENCOUNTER — Encounter: Payer: Self-pay | Admitting: *Deleted

## 2018-05-29 DIAGNOSIS — R7989 Other specified abnormal findings of blood chemistry: Secondary | ICD-10-CM

## 2018-05-29 DIAGNOSIS — I519 Heart disease, unspecified: Secondary | ICD-10-CM

## 2018-05-29 MED ORDER — LISINOPRIL 5 MG PO TABS: 5.0000 mg | ORAL_TABLET | Freq: Every day | ORAL | 9 refills | Status: DC

## 2018-05-29 NOTE — Telephone Encounter (Signed)
Pt's wife calling in today to discuss the upcoming  CTA. Pt's wife is concerned due to dye allergy stated pt was told never to get a dye procedure again.      Pt's wife could not state  if pt has ever taken medication to alleviate allergy.  Allergy looks like it has been noted from 2012.  Will send to  Truitt Merle, NP and will call pt's wife back on Monday.

## 2018-06-02 NOTE — Telephone Encounter (Signed)
lvm with instructions that a script was left up front due to dye allergy.

## 2018-06-02 NOTE — Telephone Encounter (Signed)
Left letter for upcoming procedure up front with script for prednisone. Will call pt's wife back per conversation last week.

## 2018-06-03 NOTE — Telephone Encounter (Signed)
S/w pt's wife per [facility]) is aware script for dye allergy was left up front when pt picks up letter.  Was still concerned about dye allergy pt was told never to get dye again.  Stated that is why pt is pre-medicated, pt's wife still not convinced.  Cecille Rubin put a phone call into Dr. Meda Coffee to get reassurance. 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  Stated do not suggest going to work due to pt having to take benadryl.

## 2018-06-05 ENCOUNTER — Other Ambulatory Visit: Payer: BLUE CROSS/BLUE SHIELD

## 2018-06-09 ENCOUNTER — Other Ambulatory Visit: Payer: BLUE CROSS/BLUE SHIELD | Admitting: *Deleted

## 2018-06-09 ENCOUNTER — Ambulatory Visit (INDEPENDENT_AMBULATORY_CARE_PROVIDER_SITE_OTHER): Payer: BLUE CROSS/BLUE SHIELD

## 2018-06-09 DIAGNOSIS — R9431 Abnormal electrocardiogram [ECG] [EKG]: Secondary | ICD-10-CM

## 2018-06-09 DIAGNOSIS — R7989 Other specified abnormal findings of blood chemistry: Secondary | ICD-10-CM

## 2018-06-09 DIAGNOSIS — I519 Heart disease, unspecified: Secondary | ICD-10-CM

## 2018-06-09 DIAGNOSIS — L709 Acne, unspecified: Secondary | ICD-10-CM | POA: Diagnosis not present

## 2018-06-09 DIAGNOSIS — I471 Supraventricular tachycardia: Secondary | ICD-10-CM | POA: Diagnosis not present

## 2018-06-09 DIAGNOSIS — I480 Paroxysmal atrial fibrillation: Secondary | ICD-10-CM

## 2018-06-09 LAB — BASIC METABOLIC PANEL
BUN/Creatinine Ratio: 15 (ref 9–20)
BUN: 14 mg/dL (ref 6–24)
CO2: 24 mmol/L (ref 20–29)
Calcium: 9.6 mg/dL (ref 8.7–10.2)
Chloride: 98 mmol/L (ref 96–106)
Creatinine, Ser: 0.95 mg/dL (ref 0.76–1.27)
GFR calc Af Amer: 104 mL/min/{1.73_m2} (ref >59)
GFR calc non Af Amer: 90 mL/min/{1.73_m2} (ref >59)
Glucose: 92 mg/dL (ref 65–99)
Potassium: 4.6 mmol/L (ref 3.5–5.2)
Sodium: 138 mmol/L (ref 134–144)

## 2018-06-09 LAB — T4, FREE: Free T4: 0.93 ng/dL (ref 0.82–1.77)

## 2018-06-09 LAB — TSH: TSH: 3.58 u[IU]/mL (ref 0.450–4.500)

## 2018-06-12 ENCOUNTER — Telehealth: Payer: Self-pay

## 2018-06-12 NOTE — Telephone Encounter (Signed)
Received monitor of critical trasmission at 8:55 on 06/11/18. Called patient at the time. Patient stated he was working at that time and was asymptomatic. Informed patient if there are any changes we will give him a call. Monitor showed Sinus Rhythm with run of V-tach (5 beats)/ Fusion Beats. Dr. Rayann Heman, DOD, reviewed monitor, recommends continue to monitor. Will forward to Truitt Merle NP, ordering provider.

## 2018-06-12 NOTE — Telephone Encounter (Signed)
Agree 

## 2018-06-13 ENCOUNTER — Ambulatory Visit (HOSPITAL_COMMUNITY)
Admission: RE | Admit: 2018-06-13 | Discharge: 2018-06-13 | Disposition: A | Discharge status: Home / Self Care | Payer: BLUE CROSS/BLUE SHIELD | Source: Ambulatory Visit | Attending: Nurse Practitioner | Admitting: Nurse Practitioner

## 2018-06-13 ENCOUNTER — Telehealth: Payer: Self-pay | Admitting: *Deleted

## 2018-06-13 DIAGNOSIS — D71 Functional disorders of polymorphonuclear neutrophils: Secondary | ICD-10-CM | POA: Insufficient documentation

## 2018-06-13 DIAGNOSIS — K449 Diaphragmatic hernia without obstruction or gangrene: Secondary | ICD-10-CM | POA: Diagnosis not present

## 2018-06-13 DIAGNOSIS — R7989 Other specified abnormal findings of blood chemistry: Secondary | ICD-10-CM | POA: Diagnosis not present

## 2018-06-13 DIAGNOSIS — I519 Heart disease, unspecified: Secondary | ICD-10-CM

## 2018-06-13 DIAGNOSIS — I2510 Atherosclerotic heart disease of native coronary artery without angina pectoris: Secondary | ICD-10-CM | POA: Insufficient documentation

## 2018-06-13 MED ORDER — METOPROLOL TARTRATE 5 MG/5ML IV SOLN
5.0000 mg | Freq: Once | INTRAVENOUS | Status: AC
  Administered 2018-06-13: 5 mg via INTRAVENOUS
  Filled 2018-06-13: qty 5

## 2018-06-13 MED ORDER — NITROGLYCERIN 0.4 MG SL SUBL
0.8000 mg | SUBLINGUAL_TABLET | Freq: Once | SUBLINGUAL | Status: AC
  Administered 2018-06-13: 0.8 mg via SUBLINGUAL
  Filled 2018-06-13: qty 25

## 2018-06-13 MED ORDER — METOPROLOL TARTRATE 5 MG/5ML IV SOLN
INTRAVENOUS | Status: AC
  Administered 2018-06-13: 5 mg via INTRAVENOUS
  Filled 2018-06-13: qty 5

## 2018-06-13 MED ORDER — NITROGLYCERIN 0.4 MG SL SUBL
SUBLINGUAL_TABLET | SUBLINGUAL | Status: AC
  Administered 2018-06-13: 0.8 mg via SUBLINGUAL
  Filled 2018-06-13: qty 2

## 2018-06-13 MED ORDER — IOPAMIDOL (ISOVUE-370) INJECTION 76%
100.0000 mL | Freq: Once | INTRAVENOUS | Status: AC | PRN
  Administered 2018-06-13: 100 mL via INTRAVENOUS

## 2018-06-13 NOTE — Telephone Encounter (Signed)
Pt has been notified of lab results by phone with verbal understanding. Pt thanked me for the call.   

## 2018-06-13 NOTE — Telephone Encounter (Signed)
-----   Message from Burtis Junes, NP sent at 06/10/2018  8:49 AM EDT ----- Ok to report. Labs are stable - repeat thyroid levels look fine - would continue on current regimen as previously outlined.

## 2018-06-16 ENCOUNTER — Telehealth: Payer: Self-pay | Admitting: Nurse Practitioner

## 2018-06-16 DIAGNOSIS — I519 Heart disease, unspecified: Secondary | ICD-10-CM | POA: Diagnosis not present

## 2018-06-16 DIAGNOSIS — R7989 Other specified abnormal findings of blood chemistry: Secondary | ICD-10-CM | POA: Diagnosis not present

## 2018-06-16 NOTE — Telephone Encounter (Signed)
° ° °  Spouse returning call, results

## 2018-06-17 ENCOUNTER — Encounter: Payer: Self-pay | Admitting: Nurse Practitioner

## 2018-06-30 ENCOUNTER — Telehealth: Payer: Self-pay

## 2018-06-30 NOTE — Telephone Encounter (Signed)
Spoke to patient about monitor strips we received this afternoon on 10/25 and 10/26 revealed episodes of SVT rate 168,174,172,173,165,164,157.Stated he was working at those times and was not aware heart beating fast.Stated he did get a call from monitor company.Stated he felt good no complaints.Advised I will fax to Truitt Merle NP for review.

## 2018-07-01 ENCOUNTER — Telehealth: Payer: Self-pay | Admitting: *Deleted

## 2018-07-01 NOTE — Telephone Encounter (Signed)
Hunter Nunez wanted to call pt to check to see if pt was having any dizzy spells, lightheaded, passing out.  Pt is a asymptomatic. Pt is to keep upcoming appt.

## 2018-07-01 NOTE — Telephone Encounter (Signed)
Can we see where these are?

## 2018-07-01 NOTE — Telephone Encounter (Signed)
Received fax from [facility], Lpn with strips, also printed strips from [employer].

## 2018-07-04 ENCOUNTER — Telehealth: Payer: Self-pay

## 2018-07-04 NOTE — Telephone Encounter (Signed)
Received documentation from [employer] that patient was in SVT with PVC's for 1 minute. Patient was asymptomatic, showed to DOD Dr.Berry, no changes made. Placed in [place] box.

## 2018-07-07 ENCOUNTER — Encounter: Payer: Self-pay | Admitting: Nurse Practitioner

## 2018-07-07 ENCOUNTER — Ambulatory Visit: Payer: BLUE CROSS/BLUE SHIELD | Admitting: Nurse Practitioner

## 2018-07-07 VITALS — BP 110/60 | HR 54 | Ht 70.0 in | Wt 151.4 lb

## 2018-07-07 DIAGNOSIS — E7800 Pure hypercholesterolemia, unspecified: Secondary | ICD-10-CM | POA: Diagnosis not present

## 2018-07-07 DIAGNOSIS — I519 Heart disease, unspecified: Secondary | ICD-10-CM | POA: Diagnosis not present

## 2018-07-07 DIAGNOSIS — I471 Supraventricular tachycardia: Secondary | ICD-10-CM | POA: Diagnosis not present

## 2018-07-07 DIAGNOSIS — R001 Bradycardia, unspecified: Secondary | ICD-10-CM

## 2018-07-07 NOTE — Progress Notes (Signed)
CARDIOLOGY OFFICE NOTE  Date:  07/07/2018    Hunter Nunez Date of Birth: [birth year] Medical Record #[id number]  PCP:  Patient, No Pcp Per  Cardiologist:  Servando Snare    Chief Complaint  Patient presents with  . Follow-up    Follow up after studies. Seen for Dr. Martinique.     History of Present Illness: Hunter Nunez is a 57 y.o. male who presents today for a follow up visit.  Former patient of Dr. Susa Simmonds - to establish with Dr. Grace Bushy has basically followed with me.   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 He achieved complete remission with salvage chemotherapy (ABVD/MOPP x 12 cycles). No known CAD. Chronic abnormal EKG with anteroseptal Q's and has tendency towards chronic bradycardia. Negative stress test in 2011. Has had prior splenectomy. Seen for HLD and on Zocor. He has had a benign tremor.   I saw him for his yearly check this past September. He was feeling well and had no concerns. BP typically lower at home. EKG showed what looked to be SVT and then conversion to NSR - reviewed with Dr. Lovena Le here in the office that day. We got his echo updated - EF was down some - referred for cardiac CT (no CAD) and event monitor which has shown runs of SVT - he has been asymptomatic. He has been started on ACE. He typically has a resting bradycardia.   Comes in today. Here alone. He is doing fine. He got a little dizzy with first starting the lisinopril - BP down in the 90's. This has passed but BP running much lower. He has not really felt any of the fast heart beating. No passing out spells. No chest pain. No swelling. He has spoken to Dr. Tressie Stalker - he was given cardiotoxic chemo initially when diagnosed with his Hodgkin's.    Past Medical History:  Diagnosis Date  . Abnormal EKG    Negative stress test in 2011  . History of sinus bradycardia   . Hodgkin's disease 1980   AGE 71; followed by  Dr. Tressie Stalker  . Hyperlipidemia   . Normal nuclear stress test 2011   EF is 53%. No ischemia.    Past Surgical History:  Procedure Laterality Date  . CARDIOVASCULAR STRESS TEST  11/28/2009   EF 53%  . PORT-A-CATH REMOVAL  1980   insertion and then removal after chemo  . SPLENECTOMY  1980     Medications: Current Meds  Medication Sig  . acetaminophen (TYLENOL) 325 MG tablet Take 325 mg by mouth as needed.  Marland Kitchen lisinopril (PRINIVIL,ZESTRIL) 5 MG tablet Take 1 tablet (5 mg total) by mouth daily.  . simvastatin (ZOCOR) 40 MG tablet Take 1 tablet (40 mg total) by mouth at bedtime.     Allergies: Allergies  Allergen Reactions  . Ivp Dye [Iodinated Diagnostic Agents] Hives  . Penicillins Hives  . Metoclopramide Hcl Other (See Comments)    Drawing sensation    Social History: The patient  reports that he has never smoked. He has never used smokeless tobacco. He reports that he does not drink alcohol or use drugs.   Family History: The patient's family history includes Diabetes in his mother and sister; Heart disease in his sister; Hypertension in his sister.   Review of Systems: Please see the history of present illness.   Otherwise, the review of systems is positive for none.  All other systems are reviewed and negative.   Physical Exam: VS:  BP 110/60   Pulse (!) 54   Ht 5\' 10"  (1.778 m)   Wt 151 lb 6.4 oz (68.7 kg)   SpO2 99%   BMI 21.72 kg/m  .  BMI Body mass index is 21.72 kg/m.  Wt Readings from Last 3 Encounters:  07/07/18 151 lb 6.4 oz (68.7 kg)  05/27/18 155 lb 6.4 oz (70.5 kg)  05/28/17 158 lb 6.4 oz (71.8 kg)    General: Pleasant. Alert. He is thin. He is in no acute distress.  His weight is down.  HEENT: Normal.  Neck: Supple, no JVD, carotid bruits, or masses noted.  Cardiac: Regular rate and rhythm. Systolic murmur noted. No edema.  Respiratory:  Lungs are clear to auscultation bilaterally with normal work of breathing.  GI: Soft and nontender.    MS: No deformity or atrophy. Gait and ROM intact.  Skin: Warm and dry. Color is normal.  Neuro:  Strength and sensation are intact and no gross focal deficits noted.  Psych: Alert, appropriate and with normal affect.   LABORATORY DATA:  EKG:  EKG is not ordered today.  Lab Results  Component Value Date   WBC 8.0 05/27/2018   HGB 13.0 05/27/2018   HCT 40.9 05/27/2018   PLT 339 05/27/2018   GLUCOSE 92 06/09/2018   CHOL 177 05/27/2018   TRIG 67 05/27/2018   HDL 57 05/27/2018   LDLCALC 107 (H) 05/27/2018   ALT 19 05/27/2018   AST 23 05/27/2018   NA 138 06/09/2018   K 4.6 06/09/2018   CL 98 06/09/2018   CREATININE 0.95 06/09/2018   BUN 14 06/09/2018   CO2 24 06/09/2018   TSH 3.580 06/09/2018   PSA 0.62 Test Methodology: Hybritech PSA 05/06/2007     BNP (last 3 results) No results for input(s): BNP in the last 8760 hours.  ProBNP (last 3 results) No results for input(s): PROBNP in the last 8760 hours.   Other Studies Reviewed Today:  CORONARY CT 06/2018 IMPRESSION: No evidence for hemodynamically significant coronary disease.  Dalton Mclean   Electronically Signed   By: Loralie Champagne M.D.   On: 06/16/2018 07:27   Echo Study Conclusions 05/2018  - Left ventricle: Prominent false tendon in LV apex. The cavity   size was moderately dilated. Systolic function was mildly to   moderately reduced. The estimated ejection fraction was in the   range of 40% to 45%. There is akinesis of the apicalinferolateral   myocardium. There is akinesis of the inferior myocardium. There   is akinesis of the basal-midinferoseptal myocardium. Features are   consistent with a pseudonormal left ventricular filling pattern,   with concomitant abnormal relaxation and increased filling   pressure (grade 2 diastolic dysfunction). Doppler parameters are   consistent with high ventricular filling pressure. - Aortic valve: Trileaflet; mildly thickened, mildly calcified    leaflets. - Mitral valve: There was mild to moderate regurgitation. - Left atrium: The atrium was mildly dilated. - Pulmonic valve: There was trivial regurgitation. - Pulmonary arteries: Systolic pressure could not be accurately   estimated.   Assessment/Plan:  1. SVT - I had reviewed his last EKG with Dr. Lovena Le previously - event monitor continues to show episodes of SVT - he has been asymptomatic but still concerning given his low EF (which is new) - will refer to Dr. Lovena Le ? If this is tachycardia mediated CM - his chronic bradycardia limits guideline therapy.  2. HLD - on stain - recent labs noted.   3. Chronically abnormal EKG with chronic bradycardia - see #1 - his coronary CT does not show evidence of CAD.   4. Hodgkin's - in remission. Treated with chemo - no radiation. He did receive cardiotoxic chemotherapy - this may be the cause of the reduced EF as well.   5. HTN - has had some component of white coat syndrome - BP now much lower with the addition of ACE.   6. New LV dysfunction - systolic and diastolic - started on ACE - not really able to titrate up - he has had readings in the 94'R systolic at home - lab today. Will get Dr. Tanna Furry input. Probable repeat limited echo in about 3 months. Chronic bradycardia and now soft BP will limit guideline therapy. He is currently NYHA I.   7. Valvular heart disease - will need to follow.   Current medicines are reviewed with the patient today.  The patient does not have concerns regarding medicines other than what has been noted above.  The following changes have been made:  See above.  Labs/ tests ordered today include:    Orders Placed This Encounter  Procedures  . Basic metabolic panel  . Ambulatory referral to Cardiac Electrophysiology     Disposition:   FU with Dr. Lovena Le - I will then see back.    Patient is agreeable to this plan and will call if any problems develop in the interim.   SignedTruitt Merle,  NP  07/07/2018 4:14 PM  [facility] [address] [place], [place]  [address] Phone: [phone] Fax: [phone]

## 2018-07-07 NOTE — Patient Instructions (Addendum)
We will be checking the following labs today - BMET    Medication Instructions:    Continue with your current medicines.    If you need a refill on your cardiac medications before your next appointment, please call your pharmacy.     Testing/Procedures To Be Arranged:  N/A  Follow-Up:   See Dr. Lovena Le later this month - I will see what he says and we will then decide about follow up.     At [facility], you and your health needs are our priority.  As part of our continuing mission to provide you with exceptional heart care, we have created designated Provider Care Teams.  These Care Teams include your primary Cardiologist (physician) and Advanced Practice Providers (APPs -  Physician Assistants and Nurse Practitioners) who all work together to provide you with the care you need, when you need it.  Special Instructions:  . None  Call the [place] office at [phone] if you have any questions, problems or concerns.

## 2018-07-08 LAB — BASIC METABOLIC PANEL
BUN/Creatinine Ratio: 16 (ref 9–20)
BUN: 13 mg/dL (ref 6–24)
CO2: 28 mmol/L (ref 20–29)
Calcium: 8.8 mg/dL (ref 8.7–10.2)
Chloride: 103 mmol/L (ref 96–106)
Creatinine, Ser: 0.82 mg/dL (ref 0.76–1.27)
GFR calc Af Amer: 115 mL/min/{1.73_m2} (ref >59)
GFR calc non Af Amer: 100 mL/min/{1.73_m2} (ref >59)
Glucose: 86 mg/dL (ref 65–99)
Potassium: 5.2 mmol/L (ref 3.5–5.2)
Sodium: 137 mmol/L (ref 134–144)

## 2018-07-20 DIAGNOSIS — Z23 Encounter for immunization: Secondary | ICD-10-CM | POA: Diagnosis not present

## 2018-07-25 ENCOUNTER — Encounter: Payer: Self-pay | Admitting: Internal Medicine

## 2018-07-25 ENCOUNTER — Encounter (INDEPENDENT_AMBULATORY_CARE_PROVIDER_SITE_OTHER): Payer: Self-pay

## 2018-07-25 ENCOUNTER — Ambulatory Visit: Payer: BLUE CROSS/BLUE SHIELD | Admitting: Internal Medicine

## 2018-07-25 VITALS — BP 136/80 | HR 55 | Ht 70.0 in | Wt 150.0 lb

## 2018-07-25 DIAGNOSIS — I471 Supraventricular tachycardia: Secondary | ICD-10-CM

## 2018-07-25 DIAGNOSIS — I519 Heart disease, unspecified: Secondary | ICD-10-CM | POA: Diagnosis not present

## 2018-07-25 NOTE — Patient Instructions (Addendum)
Medication Instructions:  Your physician recommends that you continue on your current medications as directed. Please refer to the Current Medication list given to you today.  Labwork: You will get lab work today:  BMP  Testing/Procedures: None ordered.  Follow-Up: Your physician wants you to follow-up in: as needed with Dr. Lovena Le.      Any Other Special Instructions Will Be Listed Below (If Applicable).  If you need a refill on your cardiac medications before your next appointment, please call your pharmacy.

## 2018-07-25 NOTE — Progress Notes (Signed)
HPI Hunter Nunez is referred today by Nelda Marseille for evaluation of SVT. He is a pleasant 57 yo man with a remote h/o Hodgkins disease, s/p chemo, who has mild LV dysfunction. He has been found to have recurrent episodes of SVT demonstrated on cardiac monitoring. He is minimally if at all symptomatic. He appears to have a wide QRS tachy (same morphology as his baseline) at 180/min. These episodes last only a few minutes. He does not have syncope or palpitations. He may have some increased dyspnea. No edema. Allergies  Allergen Reactions  . Ivp Dye [Iodinated Diagnostic Agents] Hives  . Penicillins Hives  . Metoclopramide Hcl Other (See Comments)    Drawing sensation     Current Outpatient Medications  Medication Sig Dispense Refill  . acetaminophen (TYLENOL) 325 MG tablet Take 325 mg by mouth as needed.    Marland Kitchen lisinopril (PRINIVIL,ZESTRIL) 5 MG tablet Take 1 tablet (5 mg total) by mouth daily. 30 tablet 9  . simvastatin (ZOCOR) 40 MG tablet Take 1 tablet (40 mg total) by mouth at bedtime. 90 tablet 3   No current facility-administered medications for this visit.      Past Medical History:  Diagnosis Date  . Abnormal EKG    Negative stress test in 2011  . History of sinus bradycardia   . Hodgkin's disease 1980   AGE 80; followed by Dr. Tressie Stalker  . Hyperlipidemia   . Normal nuclear stress test 2011   EF is 53%. No ischemia.    ROS:   All systems reviewed and negative except as noted in the HPI.   Past Surgical History:  Procedure Laterality Date  . CARDIOVASCULAR STRESS TEST  11/28/2009   EF 53%  . PORT-A-CATH REMOVAL  1980   insertion and then removal after chemo  . SPLENECTOMY  1980     Family History  Problem Relation Age of Onset  . Heart disease Sister   . Diabetes Sister   . Hypertension Sister   . Diabetes Mother      Social History   Socioeconomic History  . Marital status: Married    Spouse name: Not on file  . Number of children: Not on  file  . Years of education: Not on file  . Highest education level: Not on file  Occupational History  . Not on file  Social Needs  . Financial resource strain: Not on file  . Food insecurity:    Worry: Not on file    Inability: Not on file  . Transportation needs:    Medical: Not on file    Non-medical: Not on file  Tobacco Use  . Smoking status: Never Smoker  . Smokeless tobacco: Never Used  Substance and Sexual Activity  . Alcohol use: No  . Drug use: No  . Sexual activity: Yes  Lifestyle  . Physical activity:    Days per week: Not on file    Minutes per session: Not on file  . Stress: Not on file  Relationships  . Social connections:    Talks on phone: Not on file    Gets together: Not on file    Attends religious service: Not on file    Active member of club or organization: Not on file    Attends meetings of clubs or organizations: Not on file    Relationship status: Not on file  . Intimate partner violence:    Fear of current or ex partner: Not on file  Emotionally abused: Not on file    Physically abused: Not on file    Forced sexual activity: Not on file  Other Topics Concern  . Not on file  Social History Narrative  . Not on file     BP 136/80   Pulse (!) 55   Ht 5\' 10"  (1.778 m)   Wt 150 lb (68 kg)   BMI 21.52 kg/m   Physical Exam:  Well appearing middle aged man, NAD HEENT: Unremarkable Neck:  No JVD, no thyromegally Lymphatics:  No adenopathy Back:  No CVA tenderness Lungs:  Clear with no wheezes HEART:  Regular rate rhythm, no murmurs, no rubs, no clicks Abd:  soft, positive bowel sounds, no organomegally, no rebound, no guarding Ext:  2 plus pulses, no edema, no cyanosis, no clubbing Skin:  No rashes no nodules Neuro:  CN II through XII intact, motor grossly intact  EKG - sinus bradycardia with IVCD  Assess/Plan: 1. SVT - I discussed the treatment options with the patient. Because he is essentially asymptomatic, I have recommended  he undergo a period of watchful waiting. No indication for catheter ablation unless he develops more symptoms. 2. LV dysfunction - his CHF symptoms are class 1. He has mild LV dysfunction. He is unable to take a beta blocker due to bradycardia. 3. Dyslipidemia - he will continue on his statin therapy.

## 2018-07-26 LAB — BASIC METABOLIC PANEL
BUN/Creatinine Ratio: 15 (ref 9–20)
BUN: 14 mg/dL (ref 6–24)
CO2: 24 mmol/L (ref 20–29)
Calcium: 9.1 mg/dL (ref 8.7–10.2)
Chloride: 100 mmol/L (ref 96–106)
Creatinine, Ser: 0.92 mg/dL (ref 0.76–1.27)
GFR calc Af Amer: 108 mL/min/{1.73_m2} (ref >59)
GFR calc non Af Amer: 93 mL/min/{1.73_m2} (ref >59)
Glucose: 84 mg/dL (ref 65–99)
Potassium: 4.4 mmol/L (ref 3.5–5.2)
Sodium: 140 mmol/L (ref 134–144)

## 2018-07-28 ENCOUNTER — Telehealth: Payer: Self-pay | Admitting: *Deleted

## 2018-07-28 NOTE — Telephone Encounter (Signed)
lvm for pt to call office to schedule echo prior to march ov.

## 2018-07-28 NOTE — Progress Notes (Signed)
Let's repeat his echo - limited study for NICM - prior to his next visit with me please.

## 2018-10-28 DIAGNOSIS — C8128 Mixed cellularity classical Hodgkin lymphoma, lymph nodes of multiple sites: Secondary | ICD-10-CM | POA: Diagnosis not present

## 2018-10-28 DIAGNOSIS — Z9081 Acquired absence of spleen: Secondary | ICD-10-CM | POA: Diagnosis not present

## 2018-10-28 DIAGNOSIS — R718 Other abnormality of red blood cells: Secondary | ICD-10-CM | POA: Diagnosis not present

## 2018-10-28 DIAGNOSIS — R251 Tremor, unspecified: Secondary | ICD-10-CM | POA: Diagnosis not present

## 2018-11-12 ENCOUNTER — Other Ambulatory Visit (HOSPITAL_COMMUNITY): Payer: Self-pay | Admitting: Nurse Practitioner

## 2018-11-12 DIAGNOSIS — I509 Heart failure, unspecified: Secondary | ICD-10-CM

## 2018-11-14 ENCOUNTER — Ambulatory Visit (HOSPITAL_COMMUNITY): Payer: BLUE CROSS/BLUE SHIELD | Attending: Cardiology

## 2018-11-14 ENCOUNTER — Other Ambulatory Visit: Payer: Self-pay

## 2018-11-14 DIAGNOSIS — I509 Heart failure, unspecified: Secondary | ICD-10-CM | POA: Insufficient documentation

## 2018-11-18 ENCOUNTER — Other Ambulatory Visit: Payer: Self-pay

## 2018-11-18 ENCOUNTER — Ambulatory Visit: Payer: BLUE CROSS/BLUE SHIELD | Admitting: Nurse Practitioner

## 2018-11-18 ENCOUNTER — Encounter: Payer: Self-pay | Admitting: Nurse Practitioner

## 2018-11-18 VITALS — BP 144/92 | HR 66 | Ht 70.0 in | Wt 158.8 lb

## 2018-11-18 DIAGNOSIS — I428 Other cardiomyopathies: Secondary | ICD-10-CM

## 2018-11-18 DIAGNOSIS — I10 Essential (primary) hypertension: Secondary | ICD-10-CM | POA: Diagnosis not present

## 2018-11-18 DIAGNOSIS — E611 Iron deficiency: Secondary | ICD-10-CM | POA: Diagnosis not present

## 2018-11-18 DIAGNOSIS — E7800 Pure hypercholesterolemia, unspecified: Secondary | ICD-10-CM | POA: Diagnosis not present

## 2018-11-18 LAB — LIPID PANEL
Chol/HDL Ratio: 2.5 ratio (ref 0.0–5.0)
Cholesterol, Total: 173 mg/dL (ref 100–199)
HDL: 69 mg/dL (ref >39)
LDL Calculated: 94 mg/dL (ref 0–99)
Triglycerides: 52 mg/dL (ref 0–149)
VLDL Cholesterol Cal: 10 mg/dL (ref 5–40)

## 2018-11-18 MED ORDER — LISINOPRIL 5 MG PO TABS: 5.0000 mg | ORAL_TABLET | Freq: Every day | ORAL | 3 refills | Status: DC

## 2018-11-18 NOTE — Patient Instructions (Addendum)
We will be checking the following labs today - Lipids    Medication Instructions:    Continue with your current medicines.  I have refilled the Lisinopril today    If you need a refill on your cardiac medications before your next appointment, please call your pharmacy.     Testing/Procedures To Be Arranged:  N/A  Follow-Up:   See me in 6 months.     At [facility], you and your health needs are our priority.  As part of our continuing mission to provide you with exceptional heart care, we have created designated Provider Care Teams.  These Care Teams include your primary Cardiologist (physician) and Advanced Practice Providers (APPs -  Physician Assistants and Nurse Practitioners) who all work together to provide you with the care you need, when you need it.  Special Instructions:  . None  Call the [place] office at [phone] if you have any questions, problems or concerns.

## 2018-11-18 NOTE — Progress Notes (Signed)
CARDIOLOGY OFFICE NOTE  Date:  11/18/2018    Hunter Nunez Date of Birth: Jul 15, 1961 Medical Record #[id number]  PCP:  Patient, No Pcp Per  Cardiologist:  Servando Snare    Chief Complaint  Patient presents with  . Cardiomyopathy    Follow up visit.     History of Present Illness: Hunter Nunez is a 58 y.o. male who presents today for a follow up visit.  Former patient of Dr. Susa Simmonds - to establish with Dr. Grace Bushy has basically followed with me.   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 He achieved complete remission with salvage chemotherapy (ABVD/MOPP x 12 cycles). No known CAD. Chronic abnormal EKG with anteroseptal Q's and has tendency towards chronic bradycardia. Negative stress test in 2011. Has had prior splenectomy. Seen for HLD and on Zocor. He has had a benign tremor.  I saw him for his yearly check this past September of 2019.  He was feeling well and had no concerns. BP typically lower at home. EKG showed what looked to be SVT and then conversion to NSR - reviewed with Dr. Lovena Le here in the office that day. We got his echo updated - EF was down some - referred for cardiac CT (no CAD) and event monitor which has shown runs of SVT - he has been asymptomatic. He was started on ACE. He typically has a resting bradycardia. He was given cardiotoxic chemo when initially diagnosed with his Hodgkin's. We have been able to have him on a very limited CHF regimen. I did refer to EP for concern that his arrhythmia was causing a tachy mediated CM - saw Dr. Lovena Le - watchful waiting recommended - no ablation - not able to take beta blocker due to bradycardia. He has remained very asymptomatic.   Patient screened for recent travel, fever, URI symptoms and shortness of breath. Patient denies travel over the last 14 days and are currently without symptoms.    Comes in today. Here alone. He is doing well.  No chest pain. Not short of breath. BP typically always lower at home. He has had recent labs - noted in [place] - these are reviewed. Not lightheaded or dizzy - no syncope. No palpitations. He feels like he is doing well. He is to see GI later this morning to get EGD/colon arranged - has had some chronic iron deficiency anemia. No actual bleeding.    Past Medical History:  Diagnosis Date  . Abnormal EKG    Negative stress test in 2011  . History of sinus bradycardia   . Hodgkin's disease 1980   AGE 73; followed by Dr. Tressie Stalker  . Hyperlipidemia   . Normal nuclear stress test 2011   EF is 53%. No ischemia.    Past Surgical History:  Procedure Laterality Date  . CARDIOVASCULAR STRESS TEST  11/28/2009   EF 53%  . PORT-A-CATH REMOVAL  1980   insertion and then removal after chemo  . SPLENECTOMY  1980     Medications: Current Meds  Medication Sig  . acetaminophen (TYLENOL) 325 MG tablet Take 325 mg by mouth as needed.  Marland Kitchen lisinopril (PRINIVIL,ZESTRIL) 5 MG tablet Take 1 tablet (5 mg total) by mouth daily.  . simvastatin (ZOCOR) 40 MG tablet Take 1 tablet (40 mg total) by mouth at bedtime.  . [DISCONTINUED] lisinopril (PRINIVIL,ZESTRIL) 5 MG tablet Take 1 tablet (5 mg total) by mouth daily.  Allergies: Allergies  Allergen Reactions  . Ivp Dye [Iodinated Diagnostic Agents] Hives  . Penicillins Hives  . Metoclopramide Hcl Other (See Comments)    Drawing sensation    Social History: The patient  reports that he has never smoked. He has never used smokeless tobacco. He reports that he does not drink alcohol or use drugs.   Family History: The patient's family history includes Diabetes in his mother and sister; Heart disease in his sister; Hypertension in his sister.   Review of Systems: Please see the history of present illness.   Otherwise, the review of systems is positive for none.   All other systems are reviewed and negative.   Physical Exam: VS:  BP (!)  144/92 (BP Location: Left Arm, Patient Position: Sitting, Cuff Size: Normal)   Pulse 66   Ht 5\' 10"  (1.778 m)   Wt 158 lb 12.8 oz (72 kg)   SpO2 100% Comment: at rest  BMI 22.79 kg/m  .  BMI Body mass index is 22.79 kg/m.  Wt Readings from Last 3 Encounters:  11/18/18 158 lb 12.8 oz (72 kg)  07/25/18 150 lb (68 kg)  07/07/18 151 lb 6.4 oz (68.7 kg)    General: Pleasant. Well developed, well nourished and in no acute distress.   HEENT: Normal.  Neck: Supple, no JVD, carotid bruits, or masses noted.  Cardiac: Regular rate and rhythm. Heart tones are distant. No real murmur that I can appreciate. No edema.  Respiratory:  Lungs are clear to auscultation bilaterally with normal work of breathing.  GI: Soft and nontender.  MS: No deformity or atrophy. Gait and ROM intact.  Skin: Warm and dry. Color is normal.  Neuro:  Strength and sensation are intact and no gross focal deficits noted.  Psych: Alert, appropriate and with normal affect.   LABORATORY DATA:  EKG:  EKG is not ordered today.  Lab Results  Component Value Date   WBC 8.0 05/27/2018   HGB 13.0 05/27/2018   HCT 40.9 05/27/2018   PLT 339 05/27/2018   GLUCOSE 84 07/25/2018   CHOL 177 05/27/2018   TRIG 67 05/27/2018   HDL 57 05/27/2018   LDLCALC 107 (H) 05/27/2018   ALT 19 05/27/2018   AST 23 05/27/2018   NA 140 07/25/2018   K 4.4 07/25/2018   CL 100 07/25/2018   CREATININE 0.92 07/25/2018   BUN 14 07/25/2018   CO2 24 07/25/2018   TSH 3.580 06/09/2018   PSA 0.62 Test Methodology: Hybritech PSA 05/06/2007     BNP (last 3 results) No results for input(s): BNP in the last 8760 hours.  ProBNP (last 3 results) No results for input(s): PROBNP in the last 8760 hours.   Other Studies Reviewed Today:  LIMITED ECHO IMPRESSIONS 11/2018    1. The left ventricle has low normal systolic function, with an ejection fraction of 50-55%. The cavity size was mild to moderately dilated. Left ventricular diastolic Doppler  parameters are consistent with pseudonormal. Left ventricular diffuse  hypokinesis.  2. The right ventricle has normal systolc function. The cavity was normal. There is no increase in right ventricular wall thickness.  3. Mild thickening of the mitral valve leaflet.  4. The aortic valve is tricuspid.   CORONARY CT 06/2018 IMPRESSION: No evidence for hemodynamically significant coronary disease.  Dalton Mclean   Electronically Signed By: Loralie Champagne M.D. On: 06/16/2018 07:27   Echo Study Conclusions 05/2018  - Left ventricle: Prominent false tendon in LV apex. The cavity size  was moderately dilated. Systolic function was mildly to moderately reduced. The estimated ejection fraction was in the range of 40% to 45%. There is akinesis of the apicalinferolateral myocardium. There is akinesis of the inferior myocardium. There is akinesis of the basal-midinferoseptal myocardium. Features are consistent with a pseudonormal left ventricular filling pattern, with concomitant abnormal relaxation and increased filling pressure (grade 2 diastolic dysfunction). Doppler parameters are consistent with high ventricular filling pressure. - Aortic valve: Trileaflet; mildly thickened, mildly calcified leaflets. - Mitral valve: There was mild to moderate regurgitation. - Left atrium: The atrium was mildly dilated. - Pulmonic valve: There was trivial regurgitation. - Pulmonary arteries: Systolic pressure could not be accurately estimated.   Assessment/Plan:  1. NICM - he has had improvement in LV function - now low normal EF noted - bradycardia and soft BP limit guideline therapy - only on low dose ACE. He has no symptoms. NYHA I.   2. SVT - I had reviewed his prior EKG with Dr. Lovena Le previously - event monitor continues showed episodes of SVT - he has been asymptomatic but was concerning given his low EF (which was new) - he has seen Dr. Lovena Le - watchful  waiting recommended. No beta blocker due to chronic bradycardia.    3. HLD - on stain - needs lipids only today - other labs from last month noted.   4. Chronically abnormal EKG with chronic bradycardia - see #1 - his coronary CT does not show evidence of CAD.   5. Hodgkin's - in remission. Treated with chemo - no radiation. He did receive cardiotoxic chemotherapy - this may have been the cause of the reduced EF as well.   6. HTN - has had some component of white coat syndrome - BP in the low 650'P systolic at home - no changes made today.    7. Valvular heart disease/MR  - will need to follow. Plan to repeat echo in one year.   Current medicines are reviewed with the patient today.  The patient does not have concerns regarding medicines other than what has been noted above.  The following changes have been made:  See above.  Labs/ tests ordered today include:    Orders Placed This Encounter  Procedures  . Lipid panel     Disposition:   FU with me in 6 months.   Patient is agreeable to this plan and will call if any problems develop in the interim.   SignedTruitt Merle, NP  11/18/2018 8:32 AM  [facility] [address] [place], [place]  [address] Phone: [phone] Fax: [phone]

## 2019-02-19 DIAGNOSIS — K449 Diaphragmatic hernia without obstruction or gangrene: Secondary | ICD-10-CM | POA: Diagnosis not present

## 2019-02-19 DIAGNOSIS — D120 Benign neoplasm of cecum: Secondary | ICD-10-CM | POA: Diagnosis not present

## 2019-02-19 DIAGNOSIS — D509 Iron deficiency anemia, unspecified: Secondary | ICD-10-CM | POA: Diagnosis not present

## 2019-02-19 DIAGNOSIS — K2270 Barrett's esophagus without dysplasia: Secondary | ICD-10-CM | POA: Diagnosis not present

## 2019-04-02 DIAGNOSIS — K2270 Barrett's esophagus without dysplasia: Secondary | ICD-10-CM | POA: Diagnosis not present

## 2019-04-02 DIAGNOSIS — K219 Gastro-esophageal reflux disease without esophagitis: Secondary | ICD-10-CM | POA: Diagnosis not present

## 2019-05-21 NOTE — Progress Notes (Signed)
CARDIOLOGY OFFICE NOTE  Date:  05/25/2019    Hunter Nunez Date of Birth: 14-Feb-1961 Medical Record [id number]  PCP:  Patient, No Pcp Per  Cardiologist:  Servando Snare   Chief Complaint  Patient presents with  . Follow-up    History of Present Illness: Hunter Nunez is a 58 y.o. male who presents today for a 6 month check. Former patient of Dr. Susa Simmonds - to establish with Dr. Grace Bushy has basically followed with me.   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 He achieved complete remission with salvage chemotherapy (ABVD/MOPP x 12 cycles).No known CAD. Chronic abnormal EKG with anteroseptal Q's and has tendency towards chronic bradycardia. Negative stress test in 2011. 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 Noted that his bone marrow was preserved at [facility] in case he needed bone marrow tranportation. Seen for HLD and on Zocor. He has hada benign tremor.  I saw him for his yearly checkthis pastSeptember of 2019.  He was feeling well and had no concerns. BP typically lower at home. EKG showed what looked to be SVT and then conversion to NSR - reviewed with Dr. Lovena Le here in the office that day. We got his echo updated - EF was down some - referred for cardiac CT(no CAD)andeventmonitorwhich has shownruns of SVT - he has been asymptomatic. He was started on ACE.He typically has a resting bradycardia.He was given cardiotoxic chemo when initially diagnosed with his Hodgkin's. We have been able to have him on a very limited CHF regimen. I did refer to EP for concern that his arrhythmia was causing a tachy mediated CM - saw Dr. Lovena Le - watchful waiting recommended - no ablation - not able to take beta blocker due to bradycardia. He has remained very asymptomatic.   Last seen back in March - was doing well.  Getting ready to have EGD/colonoscopy with Dr. Oletta Lamas due to chronic iron deficiency anemia. Cardiac status felt to be ok.     The patient does not have symptoms concerning for COVID-19 infection (fever, chills, cough, or new shortness of breath).   Comes in today. Here alone. He is doing well. Has been placed on PPI therapy per GI. Apparently had reflux noted on his EGD. No chest pain. Breathing is fine. Weight is stable over past 6 months - has not been to the gym due to [place]. Not dizzy. Has better BP control at home - typically in the 114 to 134 range over 60 to 70's. Not dizzy. HR has been ok. No swelling. He is fasting today. He feels like he is doing well. He is going back for his oncology care with Dr. Tressie Stalker later this year.   Past Medical History:  Diagnosis Date  . Abnormal EKG    Negative stress test in 2011  . History of sinus bradycardia   . Hodgkin's disease 1980   AGE 82; followed by Dr. Tressie Stalker  . Hyperlipidemia   . Normal nuclear stress test 2011   EF is 53%. No ischemia.    Past Surgical History:  Procedure Laterality Date  . CARDIOVASCULAR STRESS TEST  11/28/2009   EF 53%  . PORT-A-CATH REMOVAL  1980   insertion and then removal after chemo  . SPLENECTOMY  1980     Medications: Current Meds  Medication Sig  . acetaminophen (TYLENOL) 325  MG tablet Take 325 mg by mouth as needed.  Marland Kitchen lisinopril (PRINIVIL,ZESTRIL) 5 MG tablet Take 1 tablet (5 mg total) by mouth daily.  Marland Kitchen omeprazole (PRILOSEC) 20 MG capsule TAKE 1 CAPSULE BY MOUTH ONCE DAILY FOR 30 DAYS  . simvastatin (ZOCOR) 40 MG tablet Take 1 tablet (40 mg total) by mouth at bedtime.     Allergies: Allergies  Allergen Reactions  . Ivp Dye [Iodinated Diagnostic Agents] Hives  . Penicillins Hives  . Metoclopramide Hcl Other (See Comments)    Drawing sensation    Social History: The patient  reports that he has never smoked. He has never used smokeless tobacco. He reports that he does not drink  alcohol or use drugs.   Family History: The patient's family history includes Diabetes in his mother and sister; Heart disease in his sister; Hypertension in his sister.   Review of Systems: Please see the history of present illness.   All other systems are reviewed and negative.   Physical Exam: VS:  Pulse 62   Ht 5\' 10"  (1.778 m)   Wt 159 lb 12.8 oz (72.5 kg)   SpO2 100% Comment: at rest  BMI 22.93 kg/m  .  BMI Body mass index is 22.93 kg/m.  Wt Readings from Last 3 Encounters:  05/25/19 159 lb 12.8 oz (72.5 kg)  11/18/18 158 lb 12.8 oz (72 kg)  07/25/18 150 lb (68 kg)   BP is 150/80 by me.   General: Pleasant. Well developed, well nourished and in no acute distress. He has let his hair grow out.    HEENT: Normal.  Neck: Supple, no JVD, carotid bruits, or masses noted.  Cardiac: Regular rate and rhythm. No murmur that I appreciate. No edema.  Respiratory:  Lungs are clear to auscultation bilaterally with normal work of breathing.  GI: Soft and nontender.  MS: No deformity or atrophy. Gait and ROM intact.  Skin: Warm and dry. Color is normal.  Neuro:  Strength and sensation are intact and no gross focal deficits noted.  Psych: Alert, appropriate and with normal affect.   LABORATORY DATA:  EKG:  EKG is not ordered today.   Lab Results  Component Value Date   WBC 8.0 05/27/2018   HGB 13.0 05/27/2018   HCT 40.9 05/27/2018   PLT 339 05/27/2018   GLUCOSE 84 07/25/2018   CHOL 173 11/18/2018   TRIG 52 11/18/2018   HDL 69 11/18/2018   LDLCALC 94 11/18/2018   ALT 19 05/27/2018   AST 23 05/27/2018   NA 140 07/25/2018   K 4.4 07/25/2018   CL 100 07/25/2018   CREATININE 0.92 07/25/2018   BUN 14 07/25/2018   CO2 24 07/25/2018   TSH 3.580 06/09/2018   PSA 0.62 Test Methodology: Hybritech PSA 05/06/2007     BNP (last 3 results) No results for input(s): BNP in the last 8760 hours.  ProBNP (last 3 results) No results for input(s): PROBNP in the last 8760 hours.    Other Studies Reviewed Today:  LIMITED ECHO IMPRESSIONS 11/2018  1. The left ventricle has low normal systolic function, with an ejection fraction of 50-55%. The cavity size was mild to moderately dilated. Left ventricular diastolic Doppler parameters are consistent with pseudonormal. Left ventricular diffuse  hypokinesis. 2. The right ventricle has normal systolc function. The cavity was normal. There is no increase in right ventricular wall thickness. 3. Mild thickening of the mitral valve leaflet. 4. The aortic valve is tricuspid.   CORONARY CT 06/2018 IMPRESSION:  No evidence for hemodynamically significant coronary disease.  Dalton Mclean   Electronically Signed By: Loralie Champagne M.D. On: 06/16/2018 07:27   EchoStudy Conclusions9/2019  - Left ventricle: Prominent false tendon in LV apex. The cavity size was moderately dilated. Systolic function was mildly to moderately reduced. The estimated ejection fraction was in the range of 40% to 45%. There is akinesis of the apicalinferolateral myocardium. There is akinesis of the inferior myocardium. There is akinesis of the basal-midinferoseptal myocardium. Features are consistent with a pseudonormal left ventricular filling pattern, with concomitant abnormal relaxation and increased filling pressure (grade 2 diastolic dysfunction). Doppler parameters are consistent with high ventricular filling pressure. - Aortic valve: Trileaflet; mildly thickened, mildly calcified leaflets. - Mitral valve: There was mild to moderate regurgitation. - Left atrium: The atrium was mildly dilated. - Pulmonic valve: There was trivial regurgitation. - Pulmonary arteries: Systolic pressure could not be accurately estimated.   Assessment/Plan:  1.NICM - he has had improvement in LV function - has low normal EF - his chronic bradycardia and soft BP (at home) has limited guideline therapy and he does  not really wish to be on any more medicine that he is currently on. Only on very low dose ACE. He is NYHA I. No changes made today. Lab today.  Will be repeating his echo on return.    2. SVT -I had reviewed his prior EKG that noted this with Dr. Lovena Le previously - event monitor showed episodes of SVT - he was asymptomatic but still concerning given his low EF (which was new) but this has improved. He was seen by Dr. Lovena Le - watchful waiting recommended. No beta blocker due to chronic bradycardia.   3. HLD - on statin - needs labs today.   4.Chronically abnormal EKG with chronic bradycardia - see #1- his coronary CT did not show evidence of CAD.  5. Hodgkin's -he remains in remission. He was treated with chemo - no radiation - did receive cardiotoxic chemotherapy - possibly the cause for his low EF.   6. HTN -has chronically had better BP control at home than here for me. No changes made today - only on low dose ACE.  7. Valvular heart disease/MR - we will need to follow. Will arrange for echo prior to return visit with me.   8. COVID-19 Education: The signs and symptoms of COVID-19 were discussed with the patient and how to seek care for testing (follow up with PCP or arrange E-visit).  The importance of social distancing, staying at home, hand hygiene and wearing a mask when out in public were discussed today.  Current medicines are reviewed with the patient today.  The patient does not have concerns regarding medicines other than what has been noted above.  The following changes have been made:  See above.  Labs/ tests ordered today include:    Orders Placed This Encounter  Procedures  . Basic metabolic panel  . CBC  . Hepatic function panel  . Lipid panel  . ECHOCARDIOGRAM COMPLETE      Disposition:   FU with me in 6 months. Lab today. Echo prior to next visit.   Patient is agreeable to this plan and will call if any problems develop in the interim.   SignedTruitt Merle, NP  05/25/2019 8:34 AM  [place] [address] [place], [place]  [address] Phone: [phone] Fax: [phone]

## 2019-05-25 ENCOUNTER — Other Ambulatory Visit: Payer: Self-pay

## 2019-05-25 ENCOUNTER — Encounter: Payer: Self-pay | Admitting: Nurse Practitioner

## 2019-05-25 ENCOUNTER — Encounter (INDEPENDENT_AMBULATORY_CARE_PROVIDER_SITE_OTHER): Payer: Self-pay

## 2019-05-25 ENCOUNTER — Ambulatory Visit: Payer: BC Managed Care – PPO | Admitting: Nurse Practitioner

## 2019-05-25 VITALS — HR 62 | Ht 70.0 in | Wt 159.8 lb

## 2019-05-25 DIAGNOSIS — I471 Supraventricular tachycardia: Secondary | ICD-10-CM

## 2019-05-25 DIAGNOSIS — R001 Bradycardia, unspecified: Secondary | ICD-10-CM

## 2019-05-25 DIAGNOSIS — I519 Heart disease, unspecified: Secondary | ICD-10-CM

## 2019-05-25 DIAGNOSIS — I428 Other cardiomyopathies: Secondary | ICD-10-CM

## 2019-05-25 DIAGNOSIS — E7800 Pure hypercholesterolemia, unspecified: Secondary | ICD-10-CM

## 2019-05-25 DIAGNOSIS — Z7189 Other specified counseling: Secondary | ICD-10-CM

## 2019-05-25 LAB — HEPATIC FUNCTION PANEL
ALT: 21 IU/L (ref 0–44)
AST: 26 IU/L (ref 0–40)
Albumin: 4.4 g/dL (ref 3.8–4.9)
Alkaline Phosphatase: 70 IU/L (ref 39–117)
Bilirubin Total: 0.5 mg/dL (ref 0.0–1.2)
Bilirubin, Direct: 0.13 mg/dL (ref 0.00–0.40)
Total Protein: 7.3 g/dL (ref 6.0–8.5)

## 2019-05-25 LAB — CBC
Hematocrit: 39 % (ref 37.5–51.0)
Hemoglobin: 12.6 g/dL — ABNORMAL LOW (ref 13.0–17.7)
MCH: 23.9 pg — ABNORMAL LOW (ref 26.6–33.0)
MCHC: 32.3 g/dL (ref 31.5–35.7)
MCV: 74 fL — ABNORMAL LOW (ref 79–97)
Platelets: 315 10*3/uL (ref 150–450)
RBC: 5.27 x10E6/uL (ref 4.14–5.80)
RDW: 14.8 % (ref 11.6–15.4)
WBC: 7.6 10*3/uL (ref 3.4–10.8)

## 2019-05-25 LAB — LIPID PANEL
Chol/HDL Ratio: 2.9 ratio (ref 0.0–5.0)
Cholesterol, Total: 172 mg/dL (ref 100–199)
HDL: 59 mg/dL (ref >39)
LDL Chol Calc (NIH): 101 mg/dL — ABNORMAL HIGH (ref 0–99)
Triglycerides: 59 mg/dL (ref 0–149)
VLDL Cholesterol Cal: 12 mg/dL (ref 5–40)

## 2019-05-25 LAB — BASIC METABOLIC PANEL
BUN/Creatinine Ratio: 17 (ref 9–20)
BUN: 16 mg/dL (ref 6–24)
CO2: 25 mmol/L (ref 20–29)
Calcium: 9.5 mg/dL (ref 8.7–10.2)
Chloride: 101 mmol/L (ref 96–106)
Creatinine, Ser: 0.92 mg/dL (ref 0.76–1.27)
GFR calc Af Amer: 107 mL/min/{1.73_m2} (ref >59)
GFR calc non Af Amer: 93 mL/min/{1.73_m2} (ref >59)
Glucose: 96 mg/dL (ref 65–99)
Potassium: 4.7 mmol/L (ref 3.5–5.2)
Sodium: 139 mmol/L (ref 134–144)

## 2019-05-25 NOTE — Patient Instructions (Addendum)
After Visit Summary:  We will be checking the following labs today - BMET, CBC, HPF, lipids   Medication Instructions:    Continue with your current medicines.    If you need a refill on your cardiac medications before your next appointment, please call your pharmacy.     Testing/Procedures To Be Arranged:  Echo in 6 months   Follow-Up:   See me in 6 months    At [facility], you and your health needs are our priority.  As part of our continuing mission to provide you with exceptional heart care, we have created designated Provider Care Teams.  These Care Teams include your primary Cardiologist (physician) and Advanced Practice Providers (APPs -  Physician Assistants and Nurse Practitioners) who all work together to provide you with the care you need, when you need it.  Special Instructions:  . Stay safe, stay home, wash your hands for at least 20 seconds and wear a mask when out in public.  . It was good to talk with you today.    Call the [place] office at [phone] if you have any questions, problems or concerns.

## 2019-09-11 DIAGNOSIS — H43813 Vitreous degeneration, bilateral: Secondary | ICD-10-CM | POA: Diagnosis not present

## 2019-10-27 DIAGNOSIS — Z8571 Personal history of Hodgkin lymphoma: Secondary | ICD-10-CM | POA: Diagnosis not present

## 2019-10-27 DIAGNOSIS — R718 Other abnormality of red blood cells: Secondary | ICD-10-CM | POA: Diagnosis not present

## 2019-10-27 DIAGNOSIS — C8128 Mixed cellularity classical Hodgkin lymphoma, lymph nodes of multiple sites: Secondary | ICD-10-CM | POA: Diagnosis not present

## 2019-10-27 DIAGNOSIS — Z9081 Acquired absence of spleen: Secondary | ICD-10-CM | POA: Diagnosis not present

## 2019-10-27 DIAGNOSIS — Z08 Encounter for follow-up examination after completed treatment for malignant neoplasm: Secondary | ICD-10-CM | POA: Diagnosis not present

## 2019-10-27 DIAGNOSIS — R251 Tremor, unspecified: Secondary | ICD-10-CM | POA: Diagnosis not present

## 2019-11-10 ENCOUNTER — Encounter (INDEPENDENT_AMBULATORY_CARE_PROVIDER_SITE_OTHER): Payer: Self-pay

## 2019-11-10 ENCOUNTER — Telehealth: Payer: Self-pay

## 2019-11-10 ENCOUNTER — Ambulatory Visit (HOSPITAL_COMMUNITY): Payer: BC Managed Care – PPO | Attending: Cardiology

## 2019-11-10 ENCOUNTER — Other Ambulatory Visit: Payer: Self-pay

## 2019-11-10 DIAGNOSIS — I428 Other cardiomyopathies: Secondary | ICD-10-CM

## 2019-11-10 NOTE — Telephone Encounter (Signed)
-----   Message from Burtis Junes, NP sent at 11/10/2019 11:16 AM EST ----- Ok to report. Will discuss further with him on follow up later this month.  Echo with mild weakness in the pumping function - EF 45 to 50% - basically unchanged from prior study.

## 2019-11-10 NOTE — Telephone Encounter (Signed)
Lpm with Echo results 3/9

## 2019-11-12 ENCOUNTER — Other Ambulatory Visit: Payer: Self-pay | Admitting: Nurse Practitioner

## 2019-11-18 ENCOUNTER — Ambulatory Visit: Payer: BC Managed Care – PPO | Admitting: Nurse Practitioner

## 2019-11-18 NOTE — Progress Notes (Signed)
CARDIOLOGY OFFICE NOTE  Date:  11/24/2019    Hunter Nunez Date of Birth: [birth year] Medical Record [id number]  PCP:  Patient, No Pcp Per  Cardiologist:  Servando Snare & [place]   Chief Complaint  Patient presents with  . Follow-up    History of Present Illness: Hunter Nunez is a 59 y.o. male who presents today for a follow up visit.  Former patient of Dr. Susa Simmonds - to establish with Dr. Grace Bushy has basically followed with me.   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 He achieved complete remission with salvage chemotherapy (ABVD/MOPP x 12 cycles).No known CAD. Chronic abnormal EKG with anteroseptal Q's and has tendency towards chronic bradycardia. Negative stress test in 2011. 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 Noted that his bone marrow was preserved at [facility] in case he needed bone marrow tranportation. Seen for HLD and on Zocor. He has hada benign tremor.  I saw him for his yearly checkthis pastSeptemberof 2019.He was feeling well and had no concerns. BP typically lower at home. EKG showed what looked to be SVT and then conversion to NSR - reviewed with Dr. Lovena Le here in the office that day. We got his echo updated - EF was down some - referred for cardiac CT(no CAD)andeventmonitorwhich has shownruns of SVT - he has been asymptomatic. Hewasstarted on ACE.He typically has a resting bradycardia.He was given cardiotoxic chemo when initially diagnosed with his Hodgkin's. We have been able to have him on a very limited CHF regimen. I did refer to EP for concern that his arrhythmia was causing a tachy mediated CM - saw Dr. Lovena Le - watchful waiting recommended - no ablation - not able to take beta blocker due to bradycardia. He has remained very asymptomatic.  Last seen back in September  - had had his EGD/colonoscopy and had been placed on PPI for reflux. Cardiac status ok.   The patient does not have symptoms concerning for COVID-19 infection (fever, chills, cough, or new shortness of breath).   Comes in today. Here alone. He is doing well. Brought me 2 jars of fulgurite (lightening strike in the sand). Has not gotten back to the gym due to [place]. Has gained just a few pounds. He is not sure about the vaccine yet. No chest pain. No palpitations. Feels pretty good. BP at home a few days ago much better than here - 128/60. Has had his medicines today.   Past Medical History:  Diagnosis Date  . Abnormal EKG    Negative stress test in 2011  . History of sinus bradycardia   . Hodgkin's disease 1980   AGE 22; followed by Dr. Tressie Stalker  . Hyperlipidemia   . Normal nuclear stress test 2011   EF is 53%. No ischemia.    Past Surgical History:  Procedure Laterality Date  . CARDIOVASCULAR STRESS TEST  11/28/2009   EF 53%  . PORT-A-CATH REMOVAL  1980   insertion and then removal after chemo  . SPLENECTOMY  1980     Medications: Current Meds  Medication Sig  . acetaminophen (TYLENOL) 325 MG tablet Take 325 mg by mouth as needed.  Marland Kitchen lisinopril (ZESTRIL) 5 MG tablet Take 1 tablet (5 mg total) by mouth daily.  Marland Kitchen omeprazole (PRILOSEC) 20 MG capsule TAKE 1 CAPSULE BY MOUTH ONCE DAILY FOR 30 DAYS  .  simvastatin (ZOCOR) 40 MG tablet Take 1 tablet (40 mg total) by mouth at bedtime.  . [DISCONTINUED] lisinopril (PRINIVIL,ZESTRIL) 5 MG tablet Take 1 tablet (5 mg total) by mouth daily.  . [DISCONTINUED] simvastatin (ZOCOR) 40 MG tablet TAKE 1 TABLET BY MOUTH AT BEDTIME     Allergies: Allergies  Allergen Reactions  . Ivp Dye [Iodinated Diagnostic Agents] Hives  . Penicillins Hives  . Metoclopramide Hcl Other (See Comments)    Drawing sensation    Social History: The patient  reports that he has never smoked. He has never used smokeless tobacco. He reports that he does not  drink alcohol or use drugs.   Family History: The patient's family history includes Diabetes in his mother and sister; Heart disease in his sister; Hypertension in his sister.   Review of Systems: Please see the history of present illness.   All other systems are reviewed and negative.   Physical Exam: VS:  BP 140/74   Pulse (!) 57   Ht 5\' 10"  (1.778 m)   Wt 162 lb 6.9 oz (73.7 kg)   SpO2 99%   BMI 23.31 kg/m  .  BMI Body mass index is 23.31 kg/m.  Wt Readings from Last 3 Encounters:  11/24/19 162 lb 6.9 oz (73.7 kg)  05/25/19 159 lb 12.8 oz (72.5 kg)  11/18/18 158 lb 12.8 oz (72 kg)   Repeat BP by me is 142/80.   General: Pleasant. Alert and in no acute distress.   HEENT: Normal.  Neck: Supple, no JVD, carotid bruits, or masses noted.  Cardiac: Regular rate and rhythm. No murmurs, rubs, or gallops. No edema.  Respiratory:  Lungs are clear to auscultation bilaterally with normal work of breathing.  GI: Soft and nontender.  MS: No deformity or atrophy. Gait and ROM intact.  Skin: Warm and dry. Color is normal.  Neuro:  Strength and sensation are intact and no gross focal deficits noted.  Psych: Alert, appropriate and with normal affect.   LABORATORY DATA:  EKG:  EKG is ordered today. This demonstrates sinus brady with IVCD - HR is 57 - personally reviewed by me and is unchanged.  Lab Results  Component Value Date   WBC 7.6 05/25/2019   HGB 12.6 (L) 05/25/2019   HCT 39.0 05/25/2019   PLT 315 05/25/2019   GLUCOSE 96 05/25/2019   CHOL 172 05/25/2019   TRIG 59 05/25/2019   HDL 59 05/25/2019   LDLCALC 101 (H) 05/25/2019   ALT 21 05/25/2019   AST 26 05/25/2019   NA 139 05/25/2019   K 4.7 05/25/2019   CL 101 05/25/2019   CREATININE 0.92 05/25/2019   BUN 16 05/25/2019   CO2 25 05/25/2019   TSH 3.580 06/09/2018   PSA 0.62 Test Methodology: Hybritech PSA 05/06/2007     BNP (last 3 results) No results for input(s): BNP in the last 8760 hours.  ProBNP (last 3  results) No results for input(s): PROBNP in the last 8760 hours.   Other Studies Reviewed Today:  ECHO 11/2019 IMPRESSIONS   1. Left ventricular ejection fraction, by estimation, is 45 to 50%. The  left ventricle has mildly decreased function. The left ventricle  demonstrates global hypokinesis. The left ventricular internal cavity size  was mildly dilated. Left ventricular  diastolic parameters are indeterminate.  2. Right ventricular systolic function is normal. The right ventricular  size is normal. Tricuspid regurgitation signal is inadequate for assessing  PA pressure.  3. The mitral valve is normal in structure.  Mild mitral valve  regurgitation.  4. The aortic valve is tricuspid. Aortic valve regurgitation is not  visualized. No aortic stenosis is present.  5. The inferior vena cava is normal in size with greater than 50%  respiratory variability, suggesting right atrial pressure of 3 mmHg.    LIMITED ECHOIMPRESSIONS3/2020  1. The left ventricle has low normal systolic function, with an ejection fraction of 50-55%. The cavity size was mild to moderately dilated. Left ventricular diastolic Doppler parameters are consistent with pseudonormal. Left ventricular diffuse  hypokinesis. 2. The right ventricle has normal systolc function. The cavity was normal. There is no increase in right ventricular wall thickness. 3. Mild thickening of the mitral valve leaflet. 4. The aortic valve is tricuspid.   CORONARY CT 06/2018 IMPRESSION: No evidence for hemodynamically significant coronary disease.  Dalton Mclean   Electronically Signed By: Loralie Champagne M.D. On: 06/16/2018 07:27    Assessment/Plan:  1. NICM - low normal EF - only on low dose ACE - typically due to his BP - may be able to titrate this up further - he is to monitor at home. No symptoms of CHF. Chronic bradycardia limits his regimen as well. NYHA I at this time. Lab today.   2. SVT -  asymptomatic - has seen EP - watchful waiting recommended. He denies palpitations.   3. HTN - BP little higher here - he is to monitor more closely - low threshold to increase his ACE  4. HLD - on statin - lab today  5. Chronically abnormal EKG - prior CT negative for CAD  6. Hodgkin's remains in remission - was treated with chemo - no radiation - did receive cardiotoxic chemo - EF low normal - will need to follow going forward. He was initially diagnosed in 1981 with stage IV B disease - had ABVD alternating with MOPP x 12 cycles - did relapse - treated with cis-platinum, VP-16 and CCNU. Considered for bone marrow transplant and apparently his bone marrow was preserved at [facility].   7. Valvular heart disease - mild MR - will follow.   8. COVID-19 Education: The signs and symptoms of COVID-19 were discussed with the patient and how to seek care for testing (follow up with PCP or arrange E-visit).  The importance of social distancing, staying at home, hand hygiene and wearing a mask when out in public were discussed today.  Current medicines are reviewed with the patient today.  The patient does not have concerns regarding medicines other than what has been noted above.  The following changes have been made:  See above.  Labs/ tests ordered today include:    Orders Placed This Encounter  Procedures  . EKG 12-Lead     Disposition:   FU with me in 6 months with fasting labs.   Patient is agreeable to this plan and will call if any problems develop in the interim.   SignedTruitt Merle, NP  11/24/2019 12:35 PM  [place] [address] [place], [place]  [address] Phone: [phone] Fax: [phone]

## 2019-11-24 ENCOUNTER — Ambulatory Visit: Payer: BC Managed Care – PPO | Admitting: Nurse Practitioner

## 2019-11-24 ENCOUNTER — Encounter: Payer: Self-pay | Admitting: Nurse Practitioner

## 2019-11-24 ENCOUNTER — Other Ambulatory Visit: Payer: Self-pay

## 2019-11-24 VITALS — BP 140/74 | HR 57 | Ht 70.0 in | Wt 162.4 lb

## 2019-11-24 DIAGNOSIS — E7800 Pure hypercholesterolemia, unspecified: Secondary | ICD-10-CM

## 2019-11-24 DIAGNOSIS — I471 Supraventricular tachycardia: Secondary | ICD-10-CM | POA: Diagnosis not present

## 2019-11-24 DIAGNOSIS — I428 Other cardiomyopathies: Secondary | ICD-10-CM | POA: Diagnosis not present

## 2019-11-24 DIAGNOSIS — Z7189 Other specified counseling: Secondary | ICD-10-CM

## 2019-11-24 DIAGNOSIS — R001 Bradycardia, unspecified: Secondary | ICD-10-CM

## 2019-11-24 MED ORDER — SIMVASTATIN 40 MG PO TABS: 40.0000 mg | ORAL_TABLET | Freq: Every day | ORAL | 3 refills | Status: DC

## 2019-11-24 MED ORDER — LISINOPRIL 5 MG PO TABS: 5.0000 mg | ORAL_TABLET | Freq: Every day | ORAL | 3 refills | Status: DC

## 2019-11-24 NOTE — Patient Instructions (Addendum)
After Visit Summary:  We will be checking the following labs today - BMET, CBC, HPF and lipids   Medication Instructions:    Continue with your current medicines.  We refilled your medicines today    If you need a refill on your cardiac medications before your next appointment, please call your pharmacy.     Testing/Procedures To Be Arranged:  N/A  Follow-Up:   See me in 6 months with fasting labs    At [facility], you and your health needs are our priority.  As part of our continuing mission to provide you with exceptional heart care, we have created designated Provider Care Teams.  These Care Teams include your primary Cardiologist (physician) and Advanced Practice Providers (APPs -  Physician Assistants and Nurse Practitioners) who all work together to provide you with the care you need, when you need it.  Special Instructions:  . Stay safe, stay home, wash your hands for at least 20 seconds and wear a mask when out in public.  . It was good to talk with you today.  Marland Kitchen Keep a check on your BP for the next week or so and then call me or send me a My Chart message and let me know - goal is below 130/80 most of the time.    Call the [place] office at [phone] if you have any questions, problems or concerns.

## 2019-11-25 LAB — HEPATIC FUNCTION PANEL
ALT: 20 IU/L (ref 0–44)
AST: 23 IU/L (ref 0–40)
Albumin: 4.5 g/dL (ref 3.8–4.9)
Alkaline Phosphatase: 72 IU/L (ref 39–117)
Bilirubin Total: 0.7 mg/dL (ref 0.0–1.2)
Bilirubin, Direct: 0.15 mg/dL (ref 0.00–0.40)
Total Protein: 7.5 g/dL (ref 6.0–8.5)

## 2019-11-25 LAB — BASIC METABOLIC PANEL
BUN/Creatinine Ratio: 20 (ref 9–20)
BUN: 17 mg/dL (ref 6–24)
CO2: 23 mmol/L (ref 20–29)
Calcium: 9.7 mg/dL (ref 8.7–10.2)
Chloride: 103 mmol/L (ref 96–106)
Creatinine, Ser: 0.84 mg/dL (ref 0.76–1.27)
GFR calc Af Amer: 112 mL/min/{1.73_m2} (ref >59)
GFR calc non Af Amer: 97 mL/min/{1.73_m2} (ref >59)
Glucose: 85 mg/dL (ref 65–99)
Potassium: 4.7 mmol/L (ref 3.5–5.2)
Sodium: 142 mmol/L (ref 134–144)

## 2019-11-25 LAB — CBC
Hematocrit: 39.2 % (ref 37.5–51.0)
Hemoglobin: 13.1 g/dL (ref 13.0–17.7)
MCH: 24.9 pg — ABNORMAL LOW (ref 26.6–33.0)
MCHC: 33.4 g/dL (ref 31.5–35.7)
MCV: 74 fL — ABNORMAL LOW (ref 79–97)
Platelets: 342 10*3/uL (ref 150–450)
RBC: 5.27 x10E6/uL (ref 4.14–5.80)
RDW: 15.5 % — ABNORMAL HIGH (ref 11.6–15.4)
WBC: 6.3 10*3/uL (ref 3.4–10.8)

## 2019-11-25 LAB — LIPID PANEL
Chol/HDL Ratio: 2.8 ratio (ref 0.0–5.0)
Cholesterol, Total: 174 mg/dL (ref 100–199)
HDL: 62 mg/dL (ref >39)
LDL Chol Calc (NIH): 102 mg/dL — ABNORMAL HIGH (ref 0–99)
Triglycerides: 50 mg/dL (ref 0–149)
VLDL Cholesterol Cal: 10 mg/dL (ref 5–40)

## 2020-01-18 ENCOUNTER — Telehealth: Payer: Self-pay | Admitting: *Deleted

## 2020-01-18 NOTE — Telephone Encounter (Signed)
Pt does not know wife is calling in today.  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 Pt was also very upset with son due to not getting ready in time for show and making pt late to show.  Pt has not felt good and Is seeing yellow. Pt's wife stated bp could have been up due to pt being angry at son.  Pt came home yesterday and did not talk with son, pt's wife stated pt always talks to son even if pt is upset. Pt's wife stated pt is seeing eye doctor due to floaters.  Pt's wife stated pt is also upset due to receiving a letter from work shaming pt into getting COVID Vaccine.  Pt does not want to get vaccine. Will send to Cecille Rubin to advise.

## 2020-01-18 NOTE — Telephone Encounter (Signed)
S/w pt's wife per [facility]) is aware of Lori's recommendations.

## 2020-01-18 NOTE — Telephone Encounter (Signed)
He needs to monitor his BP for [place]. Sounds like some of this may have been due to being upset.   We do recommend the COVID vaccine for everyone.   I am happy to see but she will need to let him know that she has called [place].   Cecille Rubin

## 2020-04-18 ENCOUNTER — Telehealth: Payer: Self-pay | Admitting: *Deleted

## 2020-04-18 NOTE — Telephone Encounter (Signed)
S/w spouse per ([place]) moved pts upcoming appt due to being virtual that day.

## 2020-05-23 NOTE — Progress Notes (Signed)
CARDIOLOGY OFFICE NOTE  Date:  06/01/2020    Hunter Nunez Date of Birth: 1961-08-29 Medical Record #[id number]  PCP:  Patient, No Pcp Per  Cardiologist:  Servando Snare & Martinique    Chief Complaint  Patient presents with  . Follow-up    History of Present Illness: Hunter Nunez is a 59 y.o. male who presents today for a 6 month check. Former patient of Dr. Susa Simmonds - to establish with Dr. Grace Bushy has basically followed with me.   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 He achieved complete remission with salvage chemotherapy (ABVD/MOPP x 12 cycles).No known CAD. Chronic abnormal EKG with anteroseptal Q's and has tendency towards chronic bradycardia. Negative stress test in 2011. 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 Noted that his bone marrow was preserved at [facility] in case he needed bone marrow tranportation. Seen for HLD and on Zocor. He has hada benign tremor.  I saw him for his yearly checkthis pastSeptemberof 2019.He was feeling well and had no concerns. BP typically lower at home. EKG showed what looked to be SVT and then conversion to NSR - reviewed with Dr. Lovena Le here in the office that day. We got his echo updated - EF was down some - referred for cardiac CT(no CAD)andeventmonitorwhich has shownruns of SVT - he has been asymptomatic. Hewasstarted on ACE.He typically has a resting bradycardia.He was given cardiotoxic chemo when initially diagnosed with his Hodgkin's. We have been able to have him on a very limited CHF regimen. I did refer to EP for concern that his arrhythmia was causing a tachy mediated CM - saw Dr. Lovena Le - watchful waiting recommended - no ablation - not able to take beta blocker due to bradycardia. He has remained very asymptomatic.  When seen back in September -  had had his EGD/colonoscopy and had been placed on PPI for reflux. Cardiac status ok. Last seen in March - he was doing well - was unsure about getting vaccinated. BP better at home.   Comes in today. Here alone. He is doing well. BP better at home - in the 120's consistently. He feels good. No chest pain. Not short of breath. No swelling. Not dizzy. No palpitations. He is considering changing oncology - Dr. Tressie Stalker has retired - no real reason to be in the [facility] system now. May consider coming to [facility].   Past Medical History:  Diagnosis Date  . Abnormal EKG    Negative stress test in 2011  . History of sinus bradycardia   . Hodgkin's disease 1980   AGE 42; followed by Dr. Tressie Stalker  . Hyperlipidemia   . Normal nuclear stress test 2011   EF is 53%. No ischemia.    Past Surgical History:  Procedure Laterality Date  . CARDIOVASCULAR STRESS TEST  11/28/2009   EF 53%  . PORT-A-CATH REMOVAL  1980   insertion and then removal after chemo  . SPLENECTOMY  1980     Medications: Current Meds  Medication Sig  . acetaminophen (TYLENOL) 325 MG tablet Take 325 mg by mouth as needed.  Marland Kitchen omeprazole (PRILOSEC) 20 MG capsule TAKE 1 CAPSULE BY MOUTH ONCE DAILY FOR 30 DAYS  . simvastatin (ZOCOR) 40 MG tablet Take 1 tablet (40 mg total) by mouth at bedtime.     Allergies: Allergies  Allergen Reactions  . Ivp  Dye [Iodinated Diagnostic Agents] Hives  . Penicillins Hives  . Metoclopramide Hcl Other (See Comments)    Drawing sensation    Social History: The patient  reports that he has never smoked. He has never used smokeless tobacco. He reports that he does not drink alcohol and does not use drugs.   Family History: The patient's family history includes Diabetes in his mother and sister; Heart disease in his sister; Hypertension in his sister.   Review of Systems: Please see the history of present illness.   All other systems are reviewed and negative.   Physical Exam: VS:  BP (!)  148/80   Pulse 68   Ht 5\' 10"  (1.778 m)   Wt 156 lb 9.6 oz (71 kg)   SpO2 98%   BMI 22.47 kg/m  .  BMI Body mass index is 22.47 kg/m.  Wt Readings from Last 3 Encounters:  06/01/20 156 lb 9.6 oz (71 kg)  11/24/19 162 lb 6.9 oz (73.7 kg)  05/25/19 159 lb 12.8 oz (72.5 kg)    General: Pleasant. Alert and in no acute distress.   Cardiac: Regular rate and rhythm. No murmurs, rubs, or gallops. No edema.  Respiratory:  Lungs are clear to auscultation bilaterally with normal work of breathing.  GI: Soft and nontender.  MS: No deformity or atrophy. Gait and ROM intact.  Skin: Warm and dry. Color is normal.  Neuro:  Strength and sensation are intact and no gross focal deficits noted.  Psych: Alert, appropriate and with normal affect.   LABORATORY DATA:  EKG:  EKG is not ordered today.    Lab Results  Component Value Date   WBC 6.3 11/24/2019   HGB 13.1 11/24/2019   HCT 39.2 11/24/2019   PLT 342 11/24/2019   GLUCOSE 85 11/24/2019   CHOL 174 11/24/2019   TRIG 50 11/24/2019   HDL 62 11/24/2019   LDLCALC 102 (H) 11/24/2019   ALT 20 11/24/2019   AST 23 11/24/2019   NA 142 11/24/2019   K 4.7 11/24/2019   CL 103 11/24/2019   CREATININE 0.84 11/24/2019   BUN 17 11/24/2019   CO2 23 11/24/2019   TSH 3.580 06/09/2018   PSA 0.62 Test Methodology: Hybritech PSA 05/06/2007     BNP (last 3 results) No results for input(s): BNP in the last 8760 hours.  ProBNP (last 3 results) No results for input(s): PROBNP in the last 8760 hours.   Other Studies Reviewed Today:  ECHO 11/2019 IMPRESSIONS   1. Left ventricular ejection fraction, by estimation, is 45 to 50%. The  left ventricle has mildly decreased function. The left ventricle  demonstrates global hypokinesis. The left ventricular internal cavity size  was mildly dilated. Left ventricular  diastolic parameters are indeterminate.  2. Right ventricular systolic function is normal. The right ventricular  size is normal.  Tricuspid regurgitation signal is inadequate for assessing  PA pressure.  3. The mitral valve is normal in structure. Mild mitral valve  regurgitation.  4. The aortic valve is tricuspid. Aortic valve regurgitation is not  visualized. No aortic stenosis is present.  5. The inferior vena cava is normal in size with greater than 50%  respiratory variability, suggesting right atrial pressure of 3 mmHg.    LIMITED ECHOIMPRESSIONS3/2020  1. The left ventricle has low normal systolic function, with an ejection fraction of 50-55%. The cavity size was mild to moderately dilated. Left ventricular diastolic Doppler parameters are consistent with pseudonormal. Left ventricular diffuse  hypokinesis. 2. The  right ventricle has normal systolc function. The cavity was normal. There is no increase in right ventricular wall thickness. 3. Mild thickening of the mitral valve leaflet. 4. The aortic valve is tricuspid.   CORONARY CT 06/2018 IMPRESSION: No evidence for hemodynamically significant coronary disease.  Dalton Mclean   Electronically Signed By: Loralie Champagne M.D. On: 06/16/2018 07:27    Assessment/Plan:  1. NICM - low normal EF - only able to be on low dose ACE due to BP typically being much lower at home and chronic bradycardia. NYHA I. Labs today.   2. SVT - asymptomatic. Has seen EP - watchful waiting recommended. No palpitations noted.   3. HTN - BP typically higher here - fine at home and much lower. He is to continue to monitor.   4. HLD - on statin - lab today - would continue therapy.   5. Chronically abnormal EKG - prior negative CT for CAD.   6. Hodgkin's - remains in remission - he had chemo only - no radiation but received cardiotoxic chemo. This was back in 1981 with stage IV B disease - had ABVD alternating with MOPP x 12 cycles - did relapse and had cis-platinum, VP-16 and CCNU. He was considered for bone marrow transplant and apparently has  bone marrow preserved at [facility].   7. Mild MR per prior echo.   Current medicines are reviewed with the patient today.  The patient does not have concerns regarding medicines other than what has been noted above.  The following changes have been made:  See above.  Labs/ tests ordered today include:    Orders Placed This Encounter  Procedures  . Basic metabolic panel  . CBC  . Hepatic function panel  . Lipid panel     Disposition:   FU with Dr. Martinique - will get re-established as new patient in 6 months. Lab today. Will continue with current regimen. He is to continue to monitor his BP at home.    Patient is agreeable to this plan and will call if any problems develop in the interim.   SignedTruitt Merle, NP  06/01/2020 8:28 AM  [facility] [address] [place], [place]  [address] Phone: [phone] Fax: [phone]

## 2020-05-30 ENCOUNTER — Ambulatory Visit: Payer: BC Managed Care – PPO | Admitting: Nurse Practitioner

## 2020-06-01 ENCOUNTER — Ambulatory Visit: Payer: BC Managed Care – PPO | Admitting: Nurse Practitioner

## 2020-06-01 ENCOUNTER — Other Ambulatory Visit: Payer: Self-pay

## 2020-06-01 ENCOUNTER — Encounter: Payer: Self-pay | Admitting: Nurse Practitioner

## 2020-06-01 VITALS — BP 148/80 | HR 68 | Ht 70.0 in | Wt 156.6 lb

## 2020-06-01 DIAGNOSIS — I428 Other cardiomyopathies: Secondary | ICD-10-CM | POA: Diagnosis not present

## 2020-06-01 DIAGNOSIS — R001 Bradycardia, unspecified: Secondary | ICD-10-CM | POA: Diagnosis not present

## 2020-06-01 DIAGNOSIS — I519 Heart disease, unspecified: Secondary | ICD-10-CM | POA: Diagnosis not present

## 2020-06-01 DIAGNOSIS — E7800 Pure hypercholesterolemia, unspecified: Secondary | ICD-10-CM | POA: Diagnosis not present

## 2020-06-01 LAB — LIPID PANEL
Chol/HDL Ratio: 2.8 ratio (ref 0.0–5.0)
Cholesterol, Total: 168 mg/dL (ref 100–199)
HDL: 60 mg/dL (ref >39)
LDL Chol Calc (NIH): 99 mg/dL (ref 0–99)
Triglycerides: 42 mg/dL (ref 0–149)
VLDL Cholesterol Cal: 9 mg/dL (ref 5–40)

## 2020-06-01 LAB — BASIC METABOLIC PANEL
BUN/Creatinine Ratio: 14 (ref 9–20)
BUN: 14 mg/dL (ref 6–24)
CO2: 25 mmol/L (ref 20–29)
Calcium: 9.4 mg/dL (ref 8.7–10.2)
Chloride: 103 mmol/L (ref 96–106)
Creatinine, Ser: 1.03 mg/dL (ref 0.76–1.27)
GFR calc Af Amer: 93 mL/min/{1.73_m2} (ref >59)
GFR calc non Af Amer: 80 mL/min/{1.73_m2} (ref >59)
Glucose: 84 mg/dL (ref 65–99)
Potassium: 4.1 mmol/L (ref 3.5–5.2)
Sodium: 140 mmol/L (ref 134–144)

## 2020-06-01 LAB — CBC
Hematocrit: 38 % (ref 37.5–51.0)
Hemoglobin: 12.4 g/dL — ABNORMAL LOW (ref 13.0–17.7)
MCH: 24.3 pg — ABNORMAL LOW (ref 26.6–33.0)
MCHC: 32.6 g/dL (ref 31.5–35.7)
MCV: 74 fL — ABNORMAL LOW (ref 79–97)
Platelets: 323 10*3/uL (ref 150–450)
RBC: 5.11 x10E6/uL (ref 4.14–5.80)
RDW: 14.9 % (ref 11.6–15.4)
WBC: 7.7 10*3/uL (ref 3.4–10.8)

## 2020-06-01 LAB — HEPATIC FUNCTION PANEL
ALT: 21 IU/L (ref 0–44)
AST: 27 IU/L (ref 0–40)
Albumin: 4.5 g/dL (ref 3.8–4.9)
Alkaline Phosphatase: 77 IU/L (ref 44–121)
Bilirubin Total: 0.7 mg/dL (ref 0.0–1.2)
Bilirubin, Direct: 0.2 mg/dL (ref 0.00–0.40)
Total Protein: 7.2 g/dL (ref 6.0–8.5)

## 2020-06-01 NOTE — Patient Instructions (Addendum)
After Visit Summary:  We will be checking the following labs today - BMET, CBC, HPF and Lipids   Medication Instructions:    Continue with your current medicines.    If you need a refill on your cardiac medications before your next appointment, please call your pharmacy.     Testing/Procedures To Be Arranged:  N/A  Follow-Up:   See Dr. Martinique in 6 months - re-establish as new patient.     At [facility], you and your health needs are our priority.  As part of our continuing mission to provide you with exceptional heart care, we have created designated Provider Care Teams.  These Care Teams include your primary Cardiologist (physician) and Advanced Practice Providers (APPs -  Physician Assistants and Nurse Practitioners) who all work together to provide you with the care you need, when you need it.  Special Instructions:  . Stay safe, wash your hands for at least 20 seconds and wear a mask when needed.  . It was good to talk with you today.  Marland Kitchen Keep a check on your BP for [place]   Call the [place] office at [phone] if you have any questions, problems or concerns.

## 2020-10-24 DIAGNOSIS — R718 Other abnormality of red blood cells: Secondary | ICD-10-CM | POA: Diagnosis not present

## 2020-10-24 DIAGNOSIS — C8128 Mixed cellularity classical Hodgkin lymphoma, lymph nodes of multiple sites: Secondary | ICD-10-CM | POA: Diagnosis not present

## 2020-11-07 NOTE — Progress Notes (Signed)
CARDIOLOGY OFFICE NOTE  Date:  11/07/2020    Hunter Nunez Date of Birth: [birth year] Medical Record #[id number]  PCP:  Patient, No Pcp Per  Cardiologist:  Lazar Tierce Martinique MD  No chief complaint on file.   History of Present Illness: Hunter Nunez is a 60 y.o. male who is seen for follow up SVT and nonischemic CM.   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 He achieved complete remission with salvage chemotherapy (ABVD/MOPP x 12 cycles).No known CAD. Chronic abnormal EKG with anteroseptal Q's and has tendency towards chronic bradycardia. Negative stress test in 2011. 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 Noted that his bone marrow was preserved at [facility] in case he needed bone marrow tranportation. Has HLD and is on Zocor. He has hada benign tremor.  Was seen in [facility] 2019. EKG showed what looked to be SVT and then conversion to NSR - reviewed with Dr. Lovena Le. Echo was updated - EF was down some - referred for cardiac CT(no CAD)andeventmonitorwhich has shownruns of SVT - he has been asymptomatic. Hewasstarted on ACE.He typically has a resting bradycardia.He was given cardiotoxic chemo when initially diagnosed with his Hodgkin's. He has been on a very limited CHF regimen. I did refer to EP for concern that his arrhythmia was causing a tachy mediated CM - saw Dr. Lovena Le - watchful waiting recommended - no ablation - not able to take beta blocker due to bradycardia. He has remained  asymptomatic.  On follow up today he is doing very well. Notes his tachycardia is doing much better. Really not aware of it. No dyspnea, chest pain, or edema. Stays active primarily at work. Notes BP fluctuates a good bit and his does feel sluggish when it gets < 110.   Past Medical History:  Diagnosis Date  .  Abnormal EKG    Negative stress test in 2011  . History of sinus bradycardia   . Hodgkin's disease 1980   AGE 71; followed by Dr. Tressie Stalker  . Hyperlipidemia   . Normal nuclear stress test 2011   EF is 53%. No ischemia.    Past Surgical History:  Procedure Laterality Date  . CARDIOVASCULAR STRESS TEST  11/28/2009   EF 53%  . PORT-A-CATH REMOVAL  1980   insertion and then removal after chemo  . SPLENECTOMY  1980     Medications: No outpatient medications have been marked as taking for the 11/11/20 encounter (Appointment) with Martinique, Raul Torrance M, MD.     Allergies: Allergies  Allergen Reactions  . Ivp Dye [Iodinated Diagnostic Agents] Hives  . Penicillins Hives  . Metoclopramide Hcl Other (See Comments)    Drawing sensation    Social History: The patient  reports that he has never smoked. He has never used smokeless tobacco. He reports that he does not drink alcohol and does not use drugs.   Family History: The patient's family history includes Diabetes in his mother and sister; Heart disease in his sister; Hypertension in his sister.   Review of Systems: Please see the history of present illness.   All other systems are reviewed and negative.   Physical Exam: VS:  There were no vitals taken for this visit. Marland Kitchen  BMI There is no height or weight on file to calculate BMI.  Wt Readings from Last 3 Encounters:  06/01/20 156 lb  9.6 oz (71 kg)  11/24/19 162 lb 6.9 oz (73.7 kg)  05/25/19 159 lb 12.8 oz (72.5 kg)   GENERAL:  Well appearing WM in NAD HEENT:  PERRL, EOMI, sclera are clear. Oropharynx is clear. NECK:  No jugular venous distention, carotid upstroke brisk and symmetric, no bruits, no thyromegaly or adenopathy LUNGS:  Clear to auscultation bilaterally CHEST:  Unremarkable HEART:  RRR,  PMI not displaced or sustained,S1 and S2 within normal limits, no S3, no S4: no clicks, no rubs, no murmurs ABD:  Soft, nontender. BS +, no masses or bruits. No hepatomegaly, no  splenomegaly EXT:  2 + pulses throughout, no edema, no cyanosis no clubbing SKIN:  Warm and dry.  No rashes NEURO:  Alert and oriented x 3. Cranial nerves II through XII intact. PSYCH:  Cognitively intact     LABORATORY DATA:  EKG:  EKG is ordered today.  NSR rate 61. IVCD. ? Old anteroseptal infarct. No change from prior. I have personally reviewed and interpreted this study.   Lab Results  Component Value Date   WBC 7.7 06/01/2020   HGB 12.4 (L) 06/01/2020   HCT 38.0 06/01/2020   PLT 323 06/01/2020   GLUCOSE 84 06/01/2020   CHOL 168 06/01/2020   TRIG 42 06/01/2020   HDL 60 06/01/2020   LDLCALC 99 06/01/2020   ALT 21 06/01/2020   AST 27 06/01/2020   NA 140 06/01/2020   K 4.1 06/01/2020   CL 103 06/01/2020   CREATININE 1.03 06/01/2020   BUN 14 06/01/2020   CO2 25 06/01/2020   TSH 3.580 06/09/2018   PSA 0.62 Test Methodology: Hybritech PSA 05/06/2007   Labs dated 10/24/20: Hgb 12.8. CRP and sed rate normal. Normal CMET.   BNP (last 3 results) No results for input(s): BNP in the last 8760 hours.  ProBNP (last 3 results) No results for input(s): PROBNP in the last 8760 hours.   Other Studies Reviewed Today:  ECHO 11/2019 IMPRESSIONS   1. Left ventricular ejection fraction, by estimation, is 45 to 50%. The  left ventricle has mildly decreased function. The left ventricle  demonstrates global hypokinesis. The left ventricular internal cavity size  was mildly dilated. Left ventricular  diastolic parameters are indeterminate.  2. Right ventricular systolic function is normal. The right ventricular  size is normal. Tricuspid regurgitation signal is inadequate for assessing  PA pressure.  3. The mitral valve is normal in structure. Mild mitral valve  regurgitation.  4. The aortic valve is tricuspid. Aortic valve regurgitation is not  visualized. No aortic stenosis is present.  5. The inferior vena cava is normal in size with greater than 50%  respiratory  variability, suggesting right atrial pressure of 3 mmHg.    LIMITED ECHOIMPRESSIONS3/2020  1. The left ventricle has low normal systolic function, with an ejection fraction of 50-55%. The cavity size was mild to moderately dilated. Left ventricular diastolic Doppler parameters are consistent with pseudonormal. Left ventricular diffuse  hypokinesis. 2. The right ventricle has normal systolc function. The cavity was normal. There is no increase in right ventricular wall thickness. 3. Mild thickening of the mitral valve leaflet. 4. The aortic valve is tricuspid.   CORONARY CT 06/2018 IMPRESSION: No evidence for hemodynamically significant coronary disease.  Dalton Mclean   Electronically Signed By: Loralie Champagne M.D. On: 06/16/2018 07:27    Assessment/Plan:  1. NICM - mildly reduced EF 45-50% - only able to be on low dose ACE due to BP typically being much lower at  home and chronic bradycardia. NYHA I. I suspect LV dysfunction is related to IVCD and old chemo.   2. SVT - asymptomatic. Has seen EP - watchful waiting recommended. No palpitations noted.   3. HTN - BP typically higher here - fine at home and much lower. He is to continue to monitor.   4. HLD - on statin  5. Chronically abnormal EKG with IVCD - prior negative CT for CAD.   6. Hodgkin's - remains in remission - he had chemo only - no radiation but received cardiotoxic chemo. This was back in 1981 with stage IV B disease - had ABVD alternating with MOPP x 12 cycles - did relapse and had cis-platinum, VP-16 and CCNU. He was considered for bone marrow transplant and apparently has bone marrow preserved at [facility].    Current medicines are reviewed with the patient today.  The patient does not have concerns regarding medicines other than what has been noted above.  The following changes have been made:  See above.  Labs/ tests ordered today include:    No orders of the defined types were placed  in this encounter.    Disposition:   FU in one year   Signed: Alfonzo Arca Martinique, MD  11/07/2020 7:23 AM

## 2020-11-09 ENCOUNTER — Other Ambulatory Visit: Payer: Self-pay

## 2020-11-09 MED ORDER — SIMVASTATIN 40 MG PO TABS: 40.0000 mg | ORAL_TABLET | Freq: Every day | ORAL | 1 refills | Status: DC

## 2020-11-11 ENCOUNTER — Other Ambulatory Visit: Payer: Self-pay

## 2020-11-11 ENCOUNTER — Encounter: Payer: Self-pay | Admitting: Cardiology

## 2020-11-11 ENCOUNTER — Ambulatory Visit: Payer: BC Managed Care – PPO | Admitting: Cardiology

## 2020-11-11 VITALS — BP 148/86 | HR 61 | Ht 70.0 in | Wt 160.0 lb

## 2020-11-11 DIAGNOSIS — I428 Other cardiomyopathies: Secondary | ICD-10-CM | POA: Diagnosis not present

## 2020-11-11 DIAGNOSIS — R001 Bradycardia, unspecified: Secondary | ICD-10-CM | POA: Diagnosis not present

## 2020-11-11 DIAGNOSIS — I471 Supraventricular tachycardia: Secondary | ICD-10-CM

## 2020-12-03 DIAGNOSIS — J329 Chronic sinusitis, unspecified: Secondary | ICD-10-CM | POA: Diagnosis not present

## 2020-12-26 ENCOUNTER — Telehealth: Payer: Self-pay

## 2020-12-26 DIAGNOSIS — I428 Other cardiomyopathies: Secondary | ICD-10-CM

## 2020-12-26 MED ORDER — LISINOPRIL 5 MG PO TABS: 5.0000 mg | ORAL_TABLET | Freq: Every day | ORAL | 3 refills | Status: DC

## 2020-12-26 NOTE — Telephone Encounter (Signed)
Prescription refill for lisinopril sent for 90 day supply to [facility] in [place]

## 2021-03-07 DIAGNOSIS — R509 Fever, unspecified: Secondary | ICD-10-CM | POA: Diagnosis not present

## 2021-03-07 DIAGNOSIS — R0981 Nasal congestion: Secondary | ICD-10-CM | POA: Diagnosis not present

## 2021-03-07 DIAGNOSIS — R519 Headache, unspecified: Secondary | ICD-10-CM | POA: Diagnosis not present

## 2021-03-07 DIAGNOSIS — Z20828 Contact with and (suspected) exposure to other viral communicable diseases: Secondary | ICD-10-CM | POA: Diagnosis not present

## 2021-04-04 DIAGNOSIS — K2270 Barrett's esophagus without dysplasia: Secondary | ICD-10-CM | POA: Diagnosis not present

## 2021-04-04 DIAGNOSIS — K219 Gastro-esophageal reflux disease without esophagitis: Secondary | ICD-10-CM | POA: Diagnosis not present

## 2021-04-04 DIAGNOSIS — K449 Diaphragmatic hernia without obstruction or gangrene: Secondary | ICD-10-CM | POA: Diagnosis not present

## 2021-04-17 DIAGNOSIS — K2100 Gastro-esophageal reflux disease with esophagitis, without bleeding: Secondary | ICD-10-CM | POA: Diagnosis not present

## 2021-04-17 DIAGNOSIS — K317 Polyp of stomach and duodenum: Secondary | ICD-10-CM | POA: Diagnosis not present

## 2021-04-17 DIAGNOSIS — K2270 Barrett's esophagus without dysplasia: Secondary | ICD-10-CM | POA: Diagnosis not present

## 2021-04-17 DIAGNOSIS — K2282 Esophagogastric junction polyp: Secondary | ICD-10-CM | POA: Diagnosis not present

## 2021-04-17 DIAGNOSIS — K2289 Other specified disease of esophagus: Secondary | ICD-10-CM | POA: Diagnosis not present

## 2021-04-25 ENCOUNTER — Other Ambulatory Visit: Payer: Self-pay | Admitting: Cardiology

## 2021-08-17 ENCOUNTER — Telehealth: Payer: Self-pay | Admitting: Cardiology

## 2021-08-17 NOTE — Telephone Encounter (Signed)
I think that if his HR is in the normal range at home and he is asymptomatic then no further evaluation is needed.  Zosia Lucchese Martinique MD, [facility]

## 2021-08-17 NOTE — Telephone Encounter (Signed)
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  This information was then sent to the his employer and the nurse called to reiterate the information.  Asked if they mentioned exactly what the rhythm issue was and he said no, just that he was going fast.  States he doesn't feel any different, feels fine.  HR when checked at home is usually 63-73.  Pt wore a monitor in 2019 that showed several episodes of SVT.  Advised I will send to Dr. Martinique to see if he feels like pt needs to wear another monitor since it has been a few years or has any other suggestions.  Pt appreciative for call.

## 2021-08-17 NOTE — Telephone Encounter (Signed)
Patient c/o Palpitations:  High priority if patient c/o lightheadedness, shortness of breath, or chest pain  How long have you had palpitations/irregular HR/ Afib? Are you having the symptoms now? 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 NO CP, SOB OR LIGHTHEADEDNESS  Do you have a history of afib (atrial fibrillation) or irregular heart rhythm? HE THINKS SO BUT HE'S NOT SURE  Have you checked your BP or HR? (document readings if available): 138/68 HR 70 EARLY THIS MORNING  Are you experiencing any other symptoms? NO OTHER SYMPTOMS

## 2021-08-18 NOTE — Telephone Encounter (Signed)
Returned call to pt. No answer left msg to call back.  

## 2021-08-18 NOTE — Telephone Encounter (Signed)
Pt wife notified of Dr. Doug Sou note and voiced understanding.

## 2021-10-22 ENCOUNTER — Other Ambulatory Visit: Payer: Self-pay | Admitting: Cardiology

## 2021-12-15 NOTE — Progress Notes (Signed)
? ? ? ?CARDIOLOGY OFFICE NOTE ? ?Date:  12/18/2021  ? ? ?Hunter Nunez ?Date of Birth: [birth year] ?Medical Record #[id number] ? ?PCP:  Patient, No Pcp Per (Inactive)  ?Cardiologist:  Wynne Jury Martin[place] MD ? ?Chief Complaint  ?Patient presents with  ? Palpitations  ? ? ?History of Present Illness: ?HESTER FORGET Nunez is a 61 y.o. male who is seen for follow up SVT and nonischemic CM.  ?  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 He achieved complete remission with salvage chemotherapy (ABVD/MOPP x 12 cycles). No known CAD.  Chronic abnormal EKG with anteroseptal Q's and has tendency towards chronic bradycardia. Negative stress test in 2011.  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 Noted that his bone marrow was preserved at [facility] in case he needed bone marrow tranportation. Has HLD and is on Zocor. He has had a benign tremor.  ?  ?Was seen in September of 2019.   EKG showed what looked to be SVT and then conversion to NSR - reviewed with Dr. Lovena Le. Echo was updated - EF was down some - referred for cardiac CT (no CAD) and event monitor which has shown runs of SVT - he has been asymptomatic. He was started on ACE. He typically has a resting bradycardia. He was given cardiotoxic chemo when initially diagnosed with his Hodgkin's. He has been on a very limited CHF regimen. I did refer to EP for concern that his arrhythmia was causing a tachy mediated CM - saw Dr. Lovena Le - watchful waiting recommended - no ablation - not able to take beta blocker due to bradycardia. He has remained  asymptomatic.  ?  ?On follow up today he is doing very well. He cannot remember when he last had racing in his heart.  No dyspnea, chest pain, or edema. 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  ? ?Past Medical History:  ?Diagnosis Date  ?  Abnormal EKG   ? Negative stress test in 2011  ? History of sinus bradycardia   ? Hodgkin's disease 1980  ? AGE 55; followed by Dr. Tressie Stalker  ? Hyperlipidemia   ? Normal nuclear stress test 2011  ? EF is 53%. No ischemia.  ? ? ?Past Surgical History:  ?Procedure Laterality Date  ? CARDIOVASCULAR STRESS TEST  11/28/2009  ? EF 53%  ? [place]  ? insertion and then removal after chemo  ? SPLENECTOMY  1980  ? ? ? ?Medications: ?Current Meds  ?Medication Sig  ? acetaminophen (TYLENOL) 325 MG tablet Take 325 mg by mouth as needed.  ? omeprazole (PRILOSEC) 20 MG capsule TAKE 1 CAPSULE BY MOUTH ONCE DAILY FOR 30 DAYS  ? [DISCONTINUED] lisinopril (ZESTRIL) 5 MG tablet Take 1 tablet (5 mg total) by mouth daily.  ? [DISCONTINUED] simvastatin (ZOCOR) 40 MG tablet TAKE 1 TABLET AT BEDTIME  ? ? ? ?Allergies: ?Allergies  ?Allergen Reactions  ? Ivp Dye [Iodinated Contrast Media] Hives  ? Penicillins Hives  ? Metoclopramide Hcl Other (See Comments)  ?  Drawing sensation  ? ? ?Social History: ?The patient  reports that he has never smoked. He has never used smokeless tobacco. He reports that he does not drink alcohol and does not use drugs. ?  ?Family History: ?The patient's family history includes Diabetes in his mother  and sister; Heart disease in his sister; Hypertension in his sister.  ? ?Review of Systems: ?Please see the history of present illness.   All other systems are reviewed and negative.  ? ?Physical Exam: ?VS:  BP 130/64   Pulse 66   Ht '5\' 10"'$  (1.778 m)   Wt 168 lb 9.6 oz (76.5 kg)   SpO2 97%   BMI 24.19 kg/m?  Marland Kitchen  BMI Body mass index is 24.19 kg/m?. ? ?Wt Readings from Last 3 Encounters:  ?12/18/21 168 lb 9.6 oz (76.5 kg)  ?11/11/20 160 lb (72.6 kg)  ?06/01/20 156 lb 9.6 oz (71 kg)  ? ?GENERAL:  Well appearing WM in NAD ?HEENT:  PERRL, EOMI, sclera are clear. Oropharynx is clear. ?NECK:  No jugular venous distention, carotid upstroke brisk and symmetric, no bruits, no thyromegaly or  adenopathy ?LUNGS:  Clear to auscultation bilaterally ?CHEST:  Unremarkable ?HEART:  RRR,  PMI not displaced or sustained,S1 and S2 within normal limits, no S3, no S4: no clicks, no rubs, no murmurs ?ABD:  Soft, nontender. BS +, no masses or bruits. No hepatomegaly, no splenomegaly ?EXT:  2 + pulses throughout, no edema, no cyanosis no clubbing ?SKIN:  Warm and dry.  No rashes ?NEURO:  Alert and oriented x 3. Cranial nerves II through XII intact. ?PSYCH:  Cognitively intact ? ? ? ? ?LABORATORY DATA: ? ?EKG:  EKG is ordered today.  NSR rate 66. Occ. PVC. LBBB.  No change from prior. I have personally reviewed and interpreted this study. ? ? ?Lab Results  ?Component Value Date  ? WBC 7.7 06/01/2020  ? HGB 12.4 (L) 06/01/2020  ? HCT 38.0 06/01/2020  ? PLT 323 06/01/2020  ? GLUCOSE 84 06/01/2020  ? CHOL 168 06/01/2020  ? TRIG 42 06/01/2020  ? HDL 60 06/01/2020  ? [place] 99 06/01/2020  ? ALT 21 06/01/2020  ? AST 27 06/01/2020  ? NA 140 06/01/2020  ? K 4.1 06/01/2020  ? CL 103 06/01/2020  ? CREATININE 1.03 06/01/2020  ? BUN 14 06/01/2020  ? CO2 25 06/01/2020  ? TSH 3.580 06/09/2018  ? PSA 0.62 Test Methodology: Hybritech PSA 05/06/2007  ? ?Labs dated 10/24/20: Hgb 12.8. CRP and sed rate normal. Normal CMET.  ? ?BNP (last 3 results) ?No results for input(s): BNP in the last 8760 hours. ? ?ProBNP (last 3 results) ?No results for input(s): PROBNP in the last 8760 hours. ? ? ?Other Studies Reviewed Today: ? ?ECHO 11/2019 IMPRESSIONS  ? ? 1. Left ventricular ejection fraction, by estimation, is 45 to 50%. The  ?left ventricle has mildly decreased function. The left ventricle  ?demonstrates global hypokinesis. The left ventricular internal cavity size  ?was mildly dilated. Left ventricular  ?diastolic parameters are indeterminate.  ? 2. Right ventricular systolic function is normal. The right ventricular  ?size is normal. Tricuspid regurgitation signal is inadequate for assessing  ?PA pressure.  ? 3. The mitral valve is normal  in structure. Mild mitral valve  ?regurgitation.  ? 4. The aortic valve is tricuspid. Aortic valve regurgitation is not  ?visualized. No aortic stenosis is present.  ? 5. The inferior vena cava is normal in size with greater than 50%  ?respiratory variability, suggesting right atrial pressure of 3 mmHg.  ?  ?  ?LIMITED ECHO IMPRESSIONS 11/2018  ?  ? 1. The left ventricle has low normal systolic function, with an ejection fraction of 50-55%. The cavity size was mild to moderately dilated. Left ventricular diastolic Doppler parameters are  consistent with pseudonormal. Left ventricular diffuse  ?hypokinesis. ? 2. The right ventricle has normal systolc function. The cavity was normal. There is no increase in right ventricular wall thickness. ? 3. Mild thickening of the mitral valve leaflet. ? 4. The aortic valve is tricuspid. ?  ?  ?CORONARY CT 06/2018 ?IMPRESSION: ?No evidence for hemodynamically significant coronary disease. ?  ?Loralie Champagne ?  ?  ?Electronically Signed ?  By: Loralie Champagne M.D. ?  On: 06/16/2018 07:27 ?  ?  ?  ?Assessment/Plan: ?  ?1. NICM - mildly reduced EF 45-50% - only able to be on low dose ACE due to BP typically being much lower at home and chronic bradycardia. NYHA I. I suspect LV dysfunction is related to IVCD and old chemo.  ? ?2. SVT - asymptomatic. Has seen EP - watchful waiting recommended. No palpitations noted.  ? ?3. HTN - well controlled.  ? ?4. HLD - on statin will update labs today ? ?5. Chronically abnormal EKG with IVCD - prior negative CT for CAD.  ? ?6. Hodgkin's - remains in remission - he had chemo only - no radiation but received cardiotoxic chemo. This was back in 1981 with stage IV B disease - had ABVD alternating with MOPP x 12 cycles - did relapse and had cis-platinum, VP-16 and CCNU. He was considered for bone marrow transplant but ultimately felt this was not needed. He apparently has bone marrow preserved at [facility].  ? ? ?Current medicines are reviewed with the patient  today.  The patient does not have concerns regarding medicines other than what has been noted above. ? ?The following changes have been made:  See above. ? ?Labs/ tests ordered today include:  ? ? ?Orders Placed This Encounter

## 2021-12-18 ENCOUNTER — Ambulatory Visit: Payer: BC Managed Care – PPO | Admitting: Cardiology

## 2021-12-18 ENCOUNTER — Encounter: Payer: Self-pay | Admitting: Cardiology

## 2021-12-18 VITALS — BP 130/64 | HR 66 | Ht 70.0 in | Wt 168.6 lb

## 2021-12-18 DIAGNOSIS — E7800 Pure hypercholesterolemia, unspecified: Secondary | ICD-10-CM | POA: Diagnosis not present

## 2021-12-18 DIAGNOSIS — I471 Supraventricular tachycardia: Secondary | ICD-10-CM | POA: Diagnosis not present

## 2021-12-18 DIAGNOSIS — I428 Other cardiomyopathies: Secondary | ICD-10-CM

## 2021-12-18 LAB — BASIC METABOLIC PANEL
BUN/Creatinine Ratio: 16 (ref 9–20)
BUN: 16 mg/dL (ref 6–24)
CO2: 22 mmol/L (ref 20–29)
Calcium: 8.9 mg/dL (ref 8.7–10.2)
Chloride: 103 mmol/L (ref 96–106)
Creatinine, Ser: 0.99 mg/dL (ref 0.76–1.27)
Glucose: 78 mg/dL (ref 70–99)
Potassium: 4.2 mmol/L (ref 3.5–5.2)
Sodium: 141 mmol/L (ref 134–144)
eGFR: 88 mL/min/{1.73_m2} (ref >59)

## 2021-12-18 LAB — CBC WITH DIFFERENTIAL/PLATELET
Basophils Absolute: 0.1 10*3/uL (ref 0.0–0.2)
Basos: 1 %
EOS (ABSOLUTE): 0.2 10*3/uL (ref 0.0–0.4)
Eos: 3 %
Hematocrit: 37.5 % (ref 37.5–51.0)
Hemoglobin: 12.4 g/dL — ABNORMAL LOW (ref 13.0–17.7)
Immature Grans (Abs): 0 10*3/uL (ref 0.0–0.1)
Immature Granulocytes: 0 %
Lymphocytes Absolute: 1.6 10*3/uL (ref 0.7–3.1)
Lymphs: 20 %
MCH: 24 pg — ABNORMAL LOW (ref 26.6–33.0)
MCHC: 33.1 g/dL (ref 31.5–35.7)
MCV: 73 fL — ABNORMAL LOW (ref 79–97)
Monocytes Absolute: 0.7 10*3/uL (ref 0.1–0.9)
Monocytes: 9 %
Neutrophils Absolute: 5.3 10*3/uL (ref 1.4–7.0)
Neutrophils: 67 %
Platelets: 317 10*3/uL (ref 150–450)
RBC: 5.17 x10E6/uL (ref 4.14–5.80)
RDW: 15.1 % (ref 11.6–15.4)
WBC: 7.9 10*3/uL (ref 3.4–10.8)

## 2021-12-18 LAB — HEPATIC FUNCTION PANEL
ALT: 19 IU/L (ref 0–44)
AST: 24 IU/L (ref 0–40)
Albumin: 4.4 g/dL (ref 3.8–4.9)
Alkaline Phosphatase: 76 IU/L (ref 44–121)
Bilirubin Total: 0.6 mg/dL (ref 0.0–1.2)
Bilirubin, Direct: 0.15 mg/dL (ref 0.00–0.40)
Total Protein: 7.1 g/dL (ref 6.0–8.5)

## 2021-12-18 LAB — LIPID PANEL
Chol/HDL Ratio: 3.5 ratio (ref 0.0–5.0)
Cholesterol, Total: 180 mg/dL (ref 100–199)
HDL: 52 mg/dL (ref >39)
LDL Chol Calc (NIH): 111 mg/dL — ABNORMAL HIGH (ref 0–99)
Triglycerides: 94 mg/dL (ref 0–149)
VLDL Cholesterol Cal: 17 mg/dL (ref 5–40)

## 2021-12-18 MED ORDER — LISINOPRIL 5 MG PO TABS: 5.0000 mg | ORAL_TABLET | Freq: Every day | ORAL | 3 refills | Status: DC

## 2021-12-18 MED ORDER — SIMVASTATIN 40 MG PO TABS: 40.0000 mg | ORAL_TABLET | Freq: Every day | ORAL | 3 refills | Status: DC

## 2021-12-19 ENCOUNTER — Other Ambulatory Visit: Payer: Self-pay

## 2021-12-19 ENCOUNTER — Telehealth: Payer: Self-pay | Admitting: Cardiology

## 2021-12-19 DIAGNOSIS — E7800 Pure hypercholesterolemia, unspecified: Secondary | ICD-10-CM

## 2021-12-19 DIAGNOSIS — I428 Other cardiomyopathies: Secondary | ICD-10-CM

## 2021-12-19 MED ORDER — ROSUVASTATIN CALCIUM 40 MG PO TABS: 40.0000 mg | ORAL_TABLET | Freq: Every day | ORAL | 3 refills | Status: DC

## 2021-12-19 NOTE — Telephone Encounter (Signed)
Pt returning call regarding results. Please advise 

## 2021-12-19 NOTE — Telephone Encounter (Signed)
Spoke to patient's wife already spoke to husband earlier today with lab results. ?

## 2021-12-27 DIAGNOSIS — C8128 Mixed cellularity classical Hodgkin lymphoma, lymph nodes of multiple sites: Secondary | ICD-10-CM | POA: Diagnosis not present

## 2021-12-27 DIAGNOSIS — I471 Supraventricular tachycardia: Secondary | ICD-10-CM | POA: Diagnosis not present

## 2021-12-27 DIAGNOSIS — D563 Thalassemia minor: Secondary | ICD-10-CM | POA: Diagnosis not present

## 2021-12-27 DIAGNOSIS — Z9081 Acquired absence of spleen: Secondary | ICD-10-CM | POA: Diagnosis not present

## 2021-12-27 DIAGNOSIS — I428 Other cardiomyopathies: Secondary | ICD-10-CM | POA: Diagnosis not present

## 2022-03-21 DIAGNOSIS — E7800 Pure hypercholesterolemia, unspecified: Secondary | ICD-10-CM | POA: Diagnosis not present

## 2022-03-21 DIAGNOSIS — I428 Other cardiomyopathies: Secondary | ICD-10-CM | POA: Diagnosis not present

## 2022-03-21 LAB — HEPATIC FUNCTION PANEL
ALT: 38 IU/L (ref 0–44)
AST: 42 IU/L — ABNORMAL HIGH (ref 0–40)
Albumin: 4.5 g/dL (ref 3.8–4.9)
Alkaline Phosphatase: 73 IU/L (ref 44–121)
Bilirubin Total: 0.4 mg/dL (ref 0.0–1.2)
Bilirubin, Direct: 0.12 mg/dL (ref 0.00–0.40)
Total Protein: 7 g/dL (ref 6.0–8.5)

## 2022-03-21 LAB — LIPID PANEL
Chol/HDL Ratio: 2.9 ratio (ref 0.0–5.0)
Cholesterol, Total: 145 mg/dL (ref 100–199)
HDL: 50 mg/dL (ref >39)
LDL Chol Calc (NIH): 80 mg/dL (ref 0–99)
Triglycerides: 76 mg/dL (ref 0–149)
VLDL Cholesterol Cal: 15 mg/dL (ref 5–40)

## 2022-05-25 DIAGNOSIS — K219 Gastro-esophageal reflux disease without esophagitis: Secondary | ICD-10-CM | POA: Diagnosis not present

## 2022-05-25 DIAGNOSIS — K2270 Barrett's esophagus without dysplasia: Secondary | ICD-10-CM | POA: Diagnosis not present

## 2022-11-30 ENCOUNTER — Other Ambulatory Visit: Payer: Self-pay | Admitting: Cardiology

## 2022-11-30 DIAGNOSIS — I428 Other cardiomyopathies: Secondary | ICD-10-CM

## 2022-12-25 DIAGNOSIS — Z0000 Encounter for general adult medical examination without abnormal findings: Secondary | ICD-10-CM | POA: Diagnosis not present

## 2022-12-25 DIAGNOSIS — D563 Thalassemia minor: Secondary | ICD-10-CM | POA: Diagnosis not present

## 2022-12-25 DIAGNOSIS — C8128 Mixed cellularity classical Hodgkin lymphoma, lymph nodes of multiple sites: Secondary | ICD-10-CM | POA: Diagnosis not present

## 2022-12-25 DIAGNOSIS — Z9081 Acquired absence of spleen: Secondary | ICD-10-CM | POA: Diagnosis not present

## 2022-12-25 DIAGNOSIS — I428 Other cardiomyopathies: Secondary | ICD-10-CM | POA: Diagnosis not present

## 2023-01-05 NOTE — Progress Notes (Signed)
CARDIOLOGY OFFICE NOTE  Date:  01/05/2023    Hunter Nunez Date of Birth: Sep 12, 1960 Medical Record #[id number]  PCP:  Patient, No Pcp Per  Cardiologist:  Stepheni Cameron [place] MD  No chief complaint on file.   History of Present Illness: Hunter Nunez is a 62 y.o. male who is seen for follow up SVT and nonischemic CM.    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 He achieved complete remission with salvage chemotherapy (ABVD/MOPP x 12 cycles). No known CAD.  Chronic abnormal EKG with anteroseptal Q's and has tendency towards chronic bradycardia. Negative stress test in 2011.  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 Noted that his bone marrow was preserved at [facility] in case he needed bone marrow tranportation. Has HLD and is on Zocor. He has had a benign tremor.    Was seen in September of 2019.   EKG showed what looked to be SVT and then conversion to NSR - reviewed with Dr. Ladona Ridgel. Echo was updated - EF was down some - referred for cardiac CT (no CAD) and event monitor which has shown runs of SVT - he has been asymptomatic. He was started on ACE. He typically has a resting bradycardia. He was given cardiotoxic chemo when initially diagnosed with his Hodgkin's. He has been on a very limited CHF regimen. He saw Dr. Ladona Ridgel - watchful waiting recommended - no ablation - not able to take beta blocker due to bradycardia. He has remained  asymptomatic.    On follow up today he is doing very well. He denies any significant tachycardia. Notes BP at home is better - typically in 130 range.   No dyspnea, chest pain, or edema. 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   Past Medical History:  Diagnosis Date   Abnormal EKG    Negative stress test in 2011   History of sinus bradycardia    Hodgkin's disease 1980    AGE 22; followed by Dr. Mariel Sleet   Hyperlipidemia    Normal nuclear stress test 2011   EF is 53%. No ischemia.    Past Surgical History:  Procedure Laterality Date   CARDIOVASCULAR STRESS TEST  11/28/2009   EF 53%   PORT-A-CATH REMOVAL  1980   insertion and then removal after chemo   SPLENECTOMY  1980     Medications: No outpatient medications have been marked as taking for the 01/11/23 encounter (Appointment) with [place], Rashidi Loh M, MD.     Allergies: Allergies  Allergen Reactions   Ivp Dye [Iodinated Contrast Media] Hives   Penicillins Hives   Metoclopramide Hcl Other (See Comments)    Drawing sensation    Social History: The patient  reports that he has never smoked. He has never used smokeless tobacco. He reports that he does not drink alcohol and does not use drugs.   Family History: The patient's family history includes Diabetes in his mother and sister; Heart disease in his sister; Hypertension in his sister.   Review of Systems: Please see the history of present illness.   All other systems are reviewed and negative.   Physical Exam: VS:  There were no vitals taken for this visit. Marland Kitchen  BMI There is no height or weight on file to calculate BMI.  Wt Readings from Last 3  Encounters:  12/18/21 168 lb 9.6 oz (76.5 kg)  11/11/20 160 lb (72.6 kg)  06/01/20 156 lb 9.6 oz (71 kg)   GENERAL:  Well appearing WM in NAD HEENT:  PERRL, EOMI, sclera are clear. Oropharynx is clear. NECK:  No jugular venous distention, carotid upstroke brisk and symmetric, no bruits, no thyromegaly or adenopathy LUNGS:  Clear to auscultation bilaterally CHEST:  Unremarkable HEART:  RRR,  PMI not displaced or sustained,S1 and S2 within normal limits, no S3, no S4: no clicks, no rubs, no murmurs ABD:  Soft, nontender. BS +, no masses or bruits. No hepatomegaly, no splenomegaly EXT:  2 + pulses throughout, no edema, no cyanosis no clubbing SKIN:  Warm and dry.  No rashes NEURO:  Alert and  oriented x 3. Cranial nerves II through XII intact. PSYCH:  Cognitively intact     LABORATORY DATA:  EKG:  EKG is ordered today.  NSR rate 51. NSIVCD.  No change from prior. I have personally reviewed and interpreted this study.   Lab Results  Component Value Date   WBC 7.9 12/18/2021   HGB 12.4 (L) 12/18/2021   HCT 37.5 12/18/2021   PLT 317 12/18/2021   GLUCOSE 78 12/18/2021   CHOL 145 03/21/2022   TRIG 76 03/21/2022   HDL 50 03/21/2022   LDLCALC 80 03/21/2022   ALT 38 03/21/2022   AST 42 (H) 03/21/2022   NA 141 12/18/2021   K 4.2 12/18/2021   CL 103 12/18/2021   CREATININE 0.99 12/18/2021   BUN 16 12/18/2021   CO2 22 12/18/2021   TSH 3.580 06/09/2018   PSA 0.62 Test Methodology: Hybritech PSA 05/06/2007   Labs dated 10/24/20: Hgb 12.8. CRP and sed rate normal. Normal CMET.   BNP (last 3 results) No results for input(s): "BNP" in the last 8760 hours.  ProBNP (last 3 results) No results for input(s): "PROBNP" in the last 8760 hours.   Other Studies Reviewed Today:  ECHO 11/2019 IMPRESSIONS    1. Left ventricular ejection fraction, by estimation, is 45 to 50%. The  left ventricle has mildly decreased function. The left ventricle  demonstrates global hypokinesis. The left ventricular internal cavity size  was mildly dilated. Left ventricular  diastolic parameters are indeterminate.   2. Right ventricular systolic function is normal. The right ventricular  size is normal. Tricuspid regurgitation signal is inadequate for assessing  PA pressure.   3. The mitral valve is normal in structure. Mild mitral valve  regurgitation.   4. The aortic valve is tricuspid. Aortic valve regurgitation is not  visualized. No aortic stenosis is present.   5. The inferior vena cava is normal in size with greater than 50%  respiratory variability, suggesting right atrial pressure of 3 mmHg.      LIMITED ECHO IMPRESSIONS 11/2018     1. The left ventricle has low normal systolic  function, with an ejection fraction of 50-55%. The cavity size was mild to moderately dilated. Left ventricular diastolic Doppler parameters are consistent with pseudonormal. Left ventricular diffuse  hypokinesis.  2. The right ventricle has normal systolc function. The cavity was normal. There is no increase in right ventricular wall thickness.  3. Mild thickening of the mitral valve leaflet.  4. The aortic valve is tricuspid.     CORONARY CT 06/2018 IMPRESSION: No evidence for hemodynamically significant coronary disease.   Hunter Nunez     Electronically Signed   By: Marca Ancona M.D.   On: 06/16/2018 07:27  Assessment/Plan:   1. NICM - mildly reduced EF 45-50% - on ACEi. Not on beta blocker due to bradycardia.  NYHA I. I suspect LV dysfunction is related to IVCD and old chemo.   2. SVT - asymptomatic. Has seen EP - watchful waiting recommended. No palpitations noted.   3. HTN - BP elevated today. Will increase lisinopril to 10 mg daily  4. HLD - labs ok last July  5. Chronically abnormal EKG with IVCD - prior negative CT for CAD.   6. Hodgkin's - remains in remission - he had chemo only - no radiation but received cardiotoxic chemo. This was back in 1981 with stage IV B disease - had ABVD alternating with MOPP x 12 cycles - did relapse and had cis-platinum, VP-16 and CCNU. He was considered for bone marrow transplant but ultimately felt this was not needed. He apparently has bone marrow preserved at [facility]. Followed by oncology.  7. Barrett's esophagus. Followed by GI   Current medicines are reviewed with the patient today.  The patient does not have concerns regarding medicines other than what has been noted above.  The following changes have been made:  See above.  Labs/ tests ordered today include:    No orders of the defined types were placed in this encounter.    Disposition:   FU in one year   Signed: Sadee Osland [place], MD  01/05/2023 5:15 PM

## 2023-01-11 ENCOUNTER — Ambulatory Visit: Payer: BC Managed Care – PPO | Attending: Cardiology | Admitting: Cardiology

## 2023-01-11 ENCOUNTER — Encounter: Payer: Self-pay | Admitting: Cardiology

## 2023-01-11 VITALS — BP 152/78 | HR 51 | Ht 70.0 in | Wt 159.4 lb

## 2023-01-11 DIAGNOSIS — I471 Supraventricular tachycardia, unspecified: Secondary | ICD-10-CM | POA: Diagnosis not present

## 2023-01-11 DIAGNOSIS — E7800 Pure hypercholesterolemia, unspecified: Secondary | ICD-10-CM | POA: Diagnosis not present

## 2023-01-11 DIAGNOSIS — I428 Other cardiomyopathies: Secondary | ICD-10-CM | POA: Diagnosis not present

## 2023-01-11 MED ORDER — LISINOPRIL 10 MG PO TABS: 10.0000 mg | ORAL_TABLET | Freq: Every day | ORAL | 3 refills | Status: DC

## 2023-01-11 NOTE — Patient Instructions (Signed)
Medication Instructions:  Increase Lisinopril to 10 mg daily Continue all other medications *If you need a refill on your cardiac medications before your next appointment, please call your pharmacy*   Lab Work: None ordered   Testing/Procedures: None ordered   Follow-Up: At [facility], you and your health needs are our priority.  As part of our continuing mission to provide you with exceptional heart care, we have created designated Provider Care Teams.  These Care Teams include your primary Cardiologist (physician) and Advanced Practice Providers (APPs -  Physician Assistants and Nurse Practitioners) who all work together to provide you with the care you need, when you need it.  We recommend signing up for the patient portal called "MyChart".  Sign up information is provided on this After Visit Summary.  MyChart is used to connect with patients for Virtual Visits (Telemedicine).  Patients are able to view lab/test results, encounter notes, upcoming appointments, etc.  Non-urgent messages can be sent to your provider as well.   To learn more about what you can do with MyChart, go to [email or web address].    Your next appointment:  1 year   Call in Feb to scheduled May appointment     Provider:  Dr.Jordan

## 2023-02-21 DIAGNOSIS — M47816 Spondylosis without myelopathy or radiculopathy, lumbar region: Secondary | ICD-10-CM | POA: Diagnosis not present

## 2023-02-21 DIAGNOSIS — M5136 Other intervertebral disc degeneration, lumbar region: Secondary | ICD-10-CM | POA: Diagnosis not present

## 2023-02-21 DIAGNOSIS — M48061 Spinal stenosis, lumbar region without neurogenic claudication: Secondary | ICD-10-CM | POA: Diagnosis not present

## 2023-02-21 DIAGNOSIS — M5459 Other low back pain: Secondary | ICD-10-CM | POA: Diagnosis not present

## 2023-05-09 DIAGNOSIS — C4441 Basal cell carcinoma of skin of scalp and neck: Secondary | ICD-10-CM | POA: Diagnosis not present

## 2023-05-09 DIAGNOSIS — L814 Other melanin hyperpigmentation: Secondary | ICD-10-CM | POA: Diagnosis not present

## 2023-05-09 DIAGNOSIS — L821 Other seborrheic keratosis: Secondary | ICD-10-CM | POA: Diagnosis not present

## 2023-05-28 DIAGNOSIS — C44329 Squamous cell carcinoma of skin of other parts of face: Secondary | ICD-10-CM | POA: Diagnosis not present

## 2023-06-25 DIAGNOSIS — H35373 Puckering of macula, bilateral: Secondary | ICD-10-CM | POA: Diagnosis not present

## 2023-09-24 DIAGNOSIS — C4442 Squamous cell carcinoma of skin of scalp and neck: Secondary | ICD-10-CM | POA: Diagnosis not present

## 2023-09-24 DIAGNOSIS — L821 Other seborrheic keratosis: Secondary | ICD-10-CM | POA: Diagnosis not present

## 2023-10-26 ENCOUNTER — Other Ambulatory Visit: Payer: Self-pay | Admitting: Cardiology

## 2023-11-18 ENCOUNTER — Other Ambulatory Visit: Payer: Self-pay | Admitting: Cardiology

## 2023-12-25 DIAGNOSIS — D563 Thalassemia minor: Secondary | ICD-10-CM | POA: Diagnosis not present

## 2023-12-25 DIAGNOSIS — H35373 Puckering of macula, bilateral: Secondary | ICD-10-CM | POA: Diagnosis not present

## 2023-12-25 DIAGNOSIS — C8128 Mixed cellularity classical Hodgkin lymphoma, lymph nodes of multiple sites: Secondary | ICD-10-CM | POA: Diagnosis not present

## 2024-01-06 ENCOUNTER — Other Ambulatory Visit: Payer: Self-pay | Admitting: Cardiology

## 2024-03-19 ENCOUNTER — Other Ambulatory Visit: Payer: Self-pay | Admitting: Cardiology

## 2024-03-20 MED ORDER — ROSUVASTATIN CALCIUM 40 MG PO TABS: 40.0000 mg | ORAL_TABLET | Freq: Every day | ORAL | 0 refills | Status: DC

## 2024-03-31 NOTE — Progress Notes (Signed)
 CARDIOLOGY OFFICE NOTE  Date:  04/09/2024    Hunter Nunez Date of Birth: 07-27-1961 Medical Record #[id number]  PCP:  Patient, No Pcp Per  Cardiologist:  Lilliam Chamblee [place] MD  Chief Complaint  Patient presents with   Palpitations   Hypertension    History of Present Illness: Hunter Nunez is a 63 y.o. male who is seen for follow up SVT, HLD and HTN.    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 He achieved complete remission with salvage chemotherapy (ABVD/MOPP x 12 cycles). No known CAD.  Chronic abnormal EKG with anteroseptal Q's and has tendency towards chronic bradycardia. Negative stress test in 2011.  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 Noted that his bone marrow was preserved at [facility] in case he needed bone marrow tranportation. Has HLD and is on Zocor . He has had a benign tremor.    Was seen in September of 2019.   EKG showed what looked to be SVT and then conversion to NSR - reviewed with Dr. Waddell. Echo was updated - EF was down some - referred for cardiac CT (no CAD) and event monitor which has shown runs of SVT - he has been asymptomatic. He was started on ACE. He typically has a resting bradycardia. He was given cardiotoxic chemo when initially diagnosed with his Hodgkin's. He has been on a very limited CHF regimen. He saw Dr. Waddell - watchful waiting recommended - no ablation - not able to take beta blocker due to bradycardia. He has remained  asymptomatic.    On follow up today he is doing very well. He denies any significant tachycardia. Notes BP at home is better - typically 124-130 range.   No dyspnea, chest pain, or edema. 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   Past Medical History:  Diagnosis Date   Abnormal EKG    Negative stress test in 2011   History of sinus  bradycardia    Hodgkin's disease 1980   AGE 66; followed by Dr. Pernell   Hyperlipidemia    Normal nuclear stress test 2011   EF is 53%. No ischemia.    Past Surgical History:  Procedure Laterality Date   CARDIOVASCULAR STRESS TEST  11/28/2009   EF 53%   PORT-A-CATH REMOVAL  1980   insertion and then removal after chemo   SPLENECTOMY  1980     Medications: Current Meds  Medication Sig   Ibuprofen (ADVIL) 200 MG CAPS Take by mouth as needed (pain).   omeprazole (PRILOSEC) 20 MG capsule TAKE 1 CAPSULE BY MOUTH ONCE DAILY FOR 30 DAYS   [DISCONTINUED] lisinopril  (ZESTRIL ) 10 MG tablet TAKE 1 TABLET DAILY   [DISCONTINUED] rosuvastatin  (CRESTOR ) 40 MG tablet Take 1 tablet (40 mg total) by mouth daily.     Allergies: Allergies  Allergen Reactions   Ivp Dye [Iodinated Contrast Media] Hives   Penicillins Hives   Metoclopramide Hcl Other (See Comments)    Drawing sensation    Social History: The patient  reports that he has never smoked. He has never used smokeless tobacco. He reports that he does not drink alcohol and does not use drugs.   Family History: The patient's family history includes Diabetes in his mother and sister; Heart disease in his sister; Hypertension in his sister.  Review of Systems: Please see the history of present illness.   All other systems are reviewed and negative.   Physical Exam: VS:  BP 126/70 (BP Location: Left Arm, Cuff Size: Normal)   Pulse (!) 58   Ht 5' 10 (1.778 m)   Wt 159 lb (72.1 kg)   SpO2 99%   BMI 22.81 kg/m  .  BMI Body mass index is 22.81 kg/m.  Wt Readings from Last 3 Encounters:  04/09/24 159 lb (72.1 kg)  01/11/23 159 lb 6.4 oz (72.3 kg)  12/18/21 168 lb 9.6 oz (76.5 kg)   GENERAL:  Well appearing WM in NAD HEENT:  PERRL, EOMI, sclera are clear. Oropharynx is clear. NECK:  No jugular venous distention, carotid upstroke brisk and symmetric, no bruits, no thyromegaly or adenopathy LUNGS:  Clear to auscultation  bilaterally CHEST:  Unremarkable HEART:  RRR,  PMI not displaced or sustained,S1 and S2 within normal limits, no S3, no S4: no clicks, no rubs, no murmurs ABD:  Soft, nontender. BS +, no masses or bruits. No hepatomegaly, no splenomegaly EXT:  2 + pulses throughout, no edema, no cyanosis no clubbing SKIN:  Warm and dry.  No rashes NEURO:  Alert and oriented x 3. Cranial nerves II through XII intact. PSYCH:  Cognitively intact     LABORATORY DATA:  EKG Interpretation Date/Time:  Thursday April 09 2024 14:41:35 EDT Ventricular Rate:  58 PR Interval:  188 QRS Duration:  126 QT Interval:  420 QTC Calculation: 412 R Axis:   -76  Text Interpretation: Sinus bradycardia Left axis deviation Non-specific intra-ventricular conduction block Minimal voltage criteria for LVH, may be normal variant ( Cornell product ) compared to prior tracing Jan 11, 2023 there is no significant change. Confirmed by [place], Earnest Thalman [phone]) on 04/09/2024 2:52:52 PM     Lab Results  Component Value Date   WBC 7.9 12/18/2021   HGB 12.4 (L) 12/18/2021   HCT 37.5 12/18/2021   PLT 317 12/18/2021   GLUCOSE 78 12/18/2021   CHOL 145 03/21/2022   TRIG 76 03/21/2022   HDL 50 03/21/2022   LDLCALC 80 03/21/2022   ALT 38 03/21/2022   AST 42 (H) 03/21/2022   NA 141 12/18/2021   K 4.2 12/18/2021   CL 103 12/18/2021   CREATININE 0.99 12/18/2021   BUN 16 12/18/2021   CO2 22 12/18/2021   TSH 3.580 06/09/2018   PSA 0.62 Test Methodology: Hy[employer] PSA 05/06/2007   Labs dated 10/24/20: Hgb 12.8. CRP and sed rate normal. Normal CMET.   BNP (last 3 results) No results for input(s): BNP in the last 8760 hours.  ProBNP (last 3 results) No results for input(s): PROBNP in the last 8760 hours.   Other Studies Reviewed Today:  ECHO 11/2019 IMPRESSIONS    1. Left ventricular ejection fraction, by estimation, is 45 to 50%. The  left ventricle has mildly decreased function. The left ventricle  demonstrates global  hypokinesis. The left ventricular internal cavity size  was mildly dilated. Left ventricular  diastolic parameters are indeterminate.   2. Right ventricular systolic function is normal. The right ventricular  size is normal. Tricuspid regurgitation signal is inadequate for assessing  PA pressure.   3. The mitral valve is normal in structure. Mild mitral valve  regurgitation.   4. The aortic valve is tricuspid. Aortic valve regurgitation is not  visualized. No aortic stenosis is present.   5. The inferior vena cava is normal in size with greater than 50%  respiratory  variability, suggesting right atrial pressure of 3 mmHg.      LIMITED ECHO IMPRESSIONS 11/2018     1. The left ventricle has low normal systolic function, with an ejection fraction of 50-55%. The cavity size was mild to moderately dilated. Left ventricular diastolic Doppler parameters are consistent with pseudonormal. Left ventricular diffuse  hypokinesis.  2. The right ventricle has normal systolc function. The cavity was normal. There is no increase in right ventricular wall thickness.  3. Mild thickening of the mitral valve leaflet.  4. The aortic valve is tricuspid.     CORONARY CT 06/2018 IMPRESSION: No evidence for hemodynamically significant coronary disease.   Dalton Mclean     Electronically Signed   By: Ezra Shuck M.D.   On: 06/16/2018 07:27       Assessment/Plan:   1. NICM - mildly reduced EF 45-50% - on ACEi. Not on beta blocker due to bradycardia.  NYHA I. I suspect LV dysfunction is related to IVCD and old chemo.   2. SVT - asymptomatic. Has seen EP - watchful waiting recommended. No palpitations noted.   3. HTN - BP controlled. Continue lisinopril  to 10 mg daily  4. HLD - will update labs today. On Crestor .   5. Chronically abnormal EKG with IVCD - prior negative CT for CAD.   6. Hodgkin's - remains in remission   7. Barrett's esophagus. Followed by GI   Current medicines are reviewed  with the patient today.  The patient does not have concerns regarding medicines other than what has been noted above.  The following changes have been made:  See above.  Labs/ tests ordered today include:    Orders Placed This Encounter  Procedures   EKG 12-Lead     Disposition:   FU in one year   Signed: Haniah Penny [place], MD  04/09/2024 3:01 PM

## 2024-04-09 ENCOUNTER — Encounter: Payer: Self-pay | Admitting: Cardiology

## 2024-04-09 ENCOUNTER — Ambulatory Visit: Attending: Cardiology | Admitting: Cardiology

## 2024-04-09 VITALS — BP 126/70 | HR 58 | Ht 70.0 in | Wt 159.0 lb

## 2024-04-09 DIAGNOSIS — E7800 Pure hypercholesterolemia, unspecified: Secondary | ICD-10-CM | POA: Diagnosis not present

## 2024-04-09 DIAGNOSIS — I10 Essential (primary) hypertension: Secondary | ICD-10-CM

## 2024-04-09 DIAGNOSIS — I471 Supraventricular tachycardia, unspecified: Secondary | ICD-10-CM

## 2024-04-09 DIAGNOSIS — I428 Other cardiomyopathies: Secondary | ICD-10-CM

## 2024-04-09 MED ORDER — LISINOPRIL 10 MG PO TABS: 10.0000 mg | ORAL_TABLET | Freq: Every day | ORAL | 3 refills | Status: AC

## 2024-04-09 MED ORDER — ROSUVASTATIN CALCIUM 40 MG PO TABS: 40.0000 mg | ORAL_TABLET | Freq: Every day | ORAL | 3 refills | Status: AC

## 2024-04-09 NOTE — Addendum Note (Signed)
 Addended by: CHRISTIANNE CHANNING PARAS on: 04/09/2024 03:06 PM   Modules accepted: Orders

## 2024-04-09 NOTE — Patient Instructions (Signed)
 Medication Instructions:  Continue same medications *If you need a refill on your cardiac medications before your next appointment, please call your pharmacy*  Lab Work: Bmet,lipid and hepatic panels today  Testing/Procedures: None ordered  Follow-Up: At [facility], you and your health needs are our priority.  As part of our continuing mission to provide you with exceptional heart care, our providers are all part of one team.  This team includes your primary Cardiologist (physician) and Advanced Practice Providers or APPs (Physician Assistants and Nurse Practitioners) who all work together to provide you with the care you need, when you need it.  Your next appointment:  1 year   Call in April to schedule August appointment    Provider:  Dr.Jordan   We recommend signing up for the patient portal called MyChart.  Sign up information is provided on this After Visit Summary.  MyChart is used to connect with patients for Virtual Visits (Telemedicine).  Patients are able to view lab/test results, encounter notes, upcoming appointments, etc.  Non-urgent messages can be sent to your provider as well.   To learn more about what you can do with MyChart, go to [email or web address].

## 2024-04-10 ENCOUNTER — Ambulatory Visit: Payer: Self-pay | Admitting: Cardiology

## 2024-04-10 LAB — BASIC METABOLIC PANEL WITH GFR
BUN/Creatinine Ratio: 13 (ref 10–24)
BUN: 12 mg/dL (ref 8–27)
CO2: 23 mmol/L (ref 20–29)
Calcium: 9.9 mg/dL (ref 8.6–10.2)
Chloride: 101 mmol/L (ref 96–106)
Creatinine, Ser: 0.89 mg/dL (ref 0.76–1.27)
Glucose: 83 mg/dL (ref 70–99)
Potassium: 4.8 mmol/L (ref 3.5–5.2)
Sodium: 141 mmol/L (ref 134–144)
eGFR: 97 mL/min/1.73 (ref >59)

## 2024-04-10 LAB — HEPATIC FUNCTION PANEL
ALT: 27 IU/L (ref 0–44)
AST: 23 IU/L (ref 0–40)
Albumin: 4.6 g/dL (ref 3.9–4.9)
Alkaline Phosphatase: 76 IU/L (ref 44–121)
Bilirubin Total: 0.5 mg/dL (ref 0.0–1.2)
Bilirubin, Direct: 0.19 mg/dL (ref 0.00–0.40)
Total Protein: 7.5 g/dL (ref 6.0–8.5)

## 2024-04-10 LAB — LIPID PANEL
Chol/HDL Ratio: 2.8 ratio (ref 0.0–5.0)
Cholesterol, Total: 157 mg/dL (ref 100–199)
HDL: 56 mg/dL (ref >39)
LDL Chol Calc (NIH): 85 mg/dL (ref 0–99)
Triglycerides: 84 mg/dL (ref 0–149)
VLDL Cholesterol Cal: 16 mg/dL (ref 5–40)
